# Patient Record
Sex: Male | Born: 1955 | Race: White | Hispanic: No | Marital: Married | Smoking: Never smoker
Health system: Southern US, Community
[De-identification: ages and names within clinical notes are randomized; demographics above are authoritative.]

## PROBLEM LIST (undated history)

## (undated) DIAGNOSIS — N400 Enlarged prostate without lower urinary tract symptoms: Secondary | ICD-10-CM

## (undated) DIAGNOSIS — I5189 Other ill-defined heart diseases: Secondary | ICD-10-CM

## (undated) DIAGNOSIS — R7303 Prediabetes: Secondary | ICD-10-CM

## (undated) DIAGNOSIS — I77810 Thoracic aortic ectasia: Secondary | ICD-10-CM

## (undated) DIAGNOSIS — I10 Essential (primary) hypertension: Secondary | ICD-10-CM

## (undated) DIAGNOSIS — E669 Obesity, unspecified: Secondary | ICD-10-CM

## (undated) DIAGNOSIS — I714 Abdominal aortic aneurysm, without rupture, unspecified: Secondary | ICD-10-CM

## (undated) DIAGNOSIS — M549 Dorsalgia, unspecified: Secondary | ICD-10-CM

## (undated) HISTORY — DX: Dorsalgia, unspecified: M54.9

## (undated) HISTORY — PX: COLONOSCOPY: SHX174

## (undated) HISTORY — DX: Essential (primary) hypertension: I10

## (undated) HISTORY — DX: Benign prostatic hyperplasia without lower urinary tract symptoms: N40.0

## (undated) HISTORY — DX: Prediabetes: R73.03

## (undated) HISTORY — DX: Other ill-defined heart diseases: I51.89

## (undated) HISTORY — PX: HEMORRHOID SURGERY: SHX153

## (undated) HISTORY — DX: Thoracic aortic ectasia: I77.810

## (undated) HISTORY — PX: POLYPECTOMY: SHX149

## (undated) HISTORY — DX: Obesity, unspecified: E66.9

---

## 1998-10-23 ENCOUNTER — Emergency Department (HOSPITAL_COMMUNITY): Admission: EM | Admit: 1998-10-23 | Discharge: 1998-10-23 | Payer: Self-pay | Admitting: Emergency Medicine

## 1999-03-31 ENCOUNTER — Emergency Department (HOSPITAL_COMMUNITY): Admission: EM | Admit: 1999-03-31 | Discharge: 1999-03-31 | Payer: Self-pay | Admitting: Emergency Medicine

## 1999-04-03 ENCOUNTER — Encounter (HOSPITAL_COMMUNITY): Admission: RE | Admit: 1999-04-03 | Discharge: 1999-04-03 | Payer: Self-pay

## 1999-05-01 ENCOUNTER — Ambulatory Visit (HOSPITAL_COMMUNITY): Admission: RE | Admit: 1999-05-01 | Discharge: 1999-05-01 | Payer: Self-pay | Admitting: Neurosurgery

## 1999-05-01 ENCOUNTER — Encounter: Payer: Self-pay | Admitting: Neurosurgery

## 1999-08-08 ENCOUNTER — Emergency Department (HOSPITAL_COMMUNITY): Admission: EM | Admit: 1999-08-08 | Discharge: 1999-08-08 | Payer: Self-pay | Admitting: Emergency Medicine

## 2000-06-30 ENCOUNTER — Emergency Department (HOSPITAL_COMMUNITY): Admission: EM | Admit: 2000-06-30 | Discharge: 2000-06-30 | Payer: Self-pay | Admitting: Emergency Medicine

## 2001-02-28 ENCOUNTER — Emergency Department (HOSPITAL_COMMUNITY): Admission: EM | Admit: 2001-02-28 | Discharge: 2001-02-28 | Payer: Self-pay | Admitting: Emergency Medicine

## 2006-12-08 ENCOUNTER — Ambulatory Visit (HOSPITAL_BASED_OUTPATIENT_CLINIC_OR_DEPARTMENT_OTHER): Admission: RE | Admit: 2006-12-08 | Discharge: 2006-12-08 | Payer: Self-pay | Admitting: Family Medicine

## 2006-12-14 ENCOUNTER — Ambulatory Visit: Payer: Self-pay | Admitting: Internal Medicine

## 2007-01-27 ENCOUNTER — Ambulatory Visit: Payer: Self-pay | Admitting: Pulmonary Disease

## 2007-08-19 ENCOUNTER — Encounter: Payer: Self-pay | Admitting: Pulmonary Disease

## 2007-08-20 ENCOUNTER — Encounter: Payer: Self-pay | Admitting: Pulmonary Disease

## 2007-08-20 DIAGNOSIS — G4733 Obstructive sleep apnea (adult) (pediatric): Secondary | ICD-10-CM | POA: Insufficient documentation

## 2007-10-01 ENCOUNTER — Ambulatory Visit: Payer: Self-pay | Admitting: Pulmonary Disease

## 2007-12-30 ENCOUNTER — Ambulatory Visit: Payer: Self-pay | Admitting: Pulmonary Disease

## 2008-06-27 ENCOUNTER — Ambulatory Visit: Payer: Self-pay | Admitting: Pulmonary Disease

## 2008-12-26 ENCOUNTER — Ambulatory Visit: Payer: Self-pay | Admitting: Pulmonary Disease

## 2009-07-14 ENCOUNTER — Encounter (INDEPENDENT_AMBULATORY_CARE_PROVIDER_SITE_OTHER): Payer: Self-pay | Admitting: *Deleted

## 2009-07-26 ENCOUNTER — Encounter (INDEPENDENT_AMBULATORY_CARE_PROVIDER_SITE_OTHER): Payer: Self-pay

## 2009-07-31 ENCOUNTER — Ambulatory Visit: Payer: Self-pay | Admitting: Gastroenterology

## 2009-08-11 ENCOUNTER — Ambulatory Visit: Payer: Self-pay | Admitting: Gastroenterology

## 2009-08-16 ENCOUNTER — Encounter: Payer: Self-pay | Admitting: Gastroenterology

## 2011-01-14 ENCOUNTER — Other Ambulatory Visit (HOSPITAL_COMMUNITY): Payer: Self-pay | Admitting: Family Medicine

## 2011-01-14 DIAGNOSIS — R05 Cough: Secondary | ICD-10-CM

## 2011-01-14 DIAGNOSIS — R059 Cough, unspecified: Secondary | ICD-10-CM

## 2011-01-14 DIAGNOSIS — T17998A Other foreign object in respiratory tract, part unspecified causing other injury, initial encounter: Secondary | ICD-10-CM

## 2011-01-15 NOTE — Assessment & Plan Note (Signed)
[facility]                             PULMONARY OFFICE NOTE   NAME:Curtis Warren, Curtis Warren                   MRN:          [id number]  DATE:01/27/2007                            DOB:          06-Sep-1955    HISTORY AND PHYSICAL:  The patient is a very pleasant 55 year old  gentleman who I have been asked to see for management of obstructive  sleep apnea.  The patient has been diagnosed with severe obstructive  sleep apnea by recent split-night study where he was found to have 92  obstructive events in 143 minutes of sleep.  This gave him an  extrapolated respiratory disturbance index of 39 events per hour.  The  patient was then placed on CPAP, and titration was initiated.  As the  pressure began to increase, central events began to appear, but not  overly severe.  He was ultimately changed over to BiPAP, and then a  final pressure of 12/80 seemed to have excellent control of his  obstructive events, and only a few central apneas that appeared to be  asymptomatic.  Patient was sent home on CPAP, and had very poor  tolerance, and therefore, was then placed on BiPAP.  Currently, the  patient is on BiPAP at 12/8 using a full face mask.  He had a difficult  time keeping his mouth closed, and therefore, needs to be on this.  Currently, he is averaging about 4 to 5 hours a night, primarily due to  dryness, and also because of itching inside of his nose.  The patient  has turned his heater up on the humidifier, but has not turned it up  further despite having dryness.  The patient typically goes to bed  between 10:30 and 11:30, and gets up between 7:30 and 8:30 to start his  day.  He is not completely rested in the mornings upon arising.  He is  currently retired, and notes mild sleep pressure with periods of  inactivity during the day, but does not feel that is overly significant.  He has no sleepiness with movies or TV in the evening.  He denies any  sleepiness with driving.  Of note, his weight is up about 10 to 12  pounds over the last 2 years.   PAST MEDICAL HISTORY:  Significant only for his sleep apnea, as stated  above.   CURRENT MEDICATIONS:  Include:  1. Flomax 0.4 mg q. daily.  2. Otherwise, an occasional p.r.n. medication with hydrocodone, Advil,      and Skelaxin.   SOCIAL HISTORY:  He is married and has children.  He has never smoked.   FAMILY HISTORY:  Remarkable for his father having heart disease, as well  as bone cancer.   REVIEW OF SYSTEMS:  As per history of present illness.  Also, see  patient intake form documented in the chart.   PHYSICAL EXAMINATION:  GENERAL:  He is an overweight male, in no acute  distress.  Blood pressure is 134/86.  Pulse 56.  Temperature is 97.7.  Weight is  200 pounds.  He is 5 feet 11 inches tall.  His O2 saturation on room air  is 97%.  HEENT:  Pupils equal, round, and reactive to light and accommodation.  Extraocular muscles are intact.  Nares shows mild septal deviation to  the right.  Oropharynx does show elongation of soft palate and uvula.  NECK:  Supple without JVD or lymphadenopathy.  There is no palpable  thyromegaly.  CHEST:  Totally clear.  CARDIAC EXAM:  Reveals regular rate and rhythm.  No murmurs, rubs, or  gallops.  ABDOMEN:  Soft and non-tender with good bowel sounds.  GENITAL EXAM:  Was not done, and not indicated.  RECTAL EXAM:  Was not done, and not indicated.  BREAST EXAM:  Was not done, and not indicated.  LOWER EXTREMITIES:  Without significant edema.  Pulses are intact  distally.  NEUROLOGIC:  Alert and oriented with no obvious motor deficits.   IMPRESSION:  Severe obstructive sleep apnea/hypopnea syndrome by split-  night study with a respiratory disturbance index of 39 events per hour.  Patient did poorly on CPAP, and his now using BiPAP with some success.  I am really not overly concerned about the central events that he had  during the study, and  they appeared definitely induced by the initiation  of the CPAP.  They were much less while on BiPAP.  At this point in  time, my goal will be to get him completely comfortable with the BiPAP,  and wearing it on a consistent basis, and then we will work on pressure  optimization.  I am not sure the pressure of 12/8 is totally adequate.  Patient had a split-night study, and therefore, had very little time  left for titration the rest of the night, and the titration was  complicated by the presence of central apneas.   PLAN:  1. Work on weight loss.  2. I have asked the patient to turn up the heat on his humidifier to      see if this will help with his dryness, and also his nasal itching.      I suspect as time goes by, that he will get more accustomed to the      CPAP, and wear it on a more regular basis.  I will give him 2 weeks      to get further adjusted, and then arrange for an auto BiPAP device      to be delivered, and used for the next 2 weeks for pressure      optimization.  I will then call the patient with his optimal      pressure.  3. The patient will follow up in 3 months, or sooner if there are      problems.     Barbaraann Share, MD,FCCP  Electronically Signed    [facility]/Med[facility]  DD: 01/27/2007  DT: 01/27/2007  Job #: [id number]   cc:   Dario Guardian, M.D.

## 2011-01-16 ENCOUNTER — Ambulatory Visit (HOSPITAL_COMMUNITY)
Admission: RE | Admit: 2011-01-16 | Discharge: 2011-01-16 | Disposition: A | Discharge status: Home / Self Care | Payer: 59 | Source: Ambulatory Visit | Attending: Family Medicine | Admitting: Family Medicine

## 2011-01-16 DIAGNOSIS — R059 Cough, unspecified: Secondary | ICD-10-CM | POA: Insufficient documentation

## 2011-01-16 DIAGNOSIS — R05 Cough: Secondary | ICD-10-CM | POA: Insufficient documentation

## 2011-01-16 DIAGNOSIS — R0602 Shortness of breath: Secondary | ICD-10-CM | POA: Insufficient documentation

## 2011-01-16 DIAGNOSIS — R1310 Dysphagia, unspecified: Secondary | ICD-10-CM | POA: Insufficient documentation

## 2011-01-16 DIAGNOSIS — T17998A Other foreign object in respiratory tract, part unspecified causing other injury, initial encounter: Secondary | ICD-10-CM

## 2011-01-16 DIAGNOSIS — M47812 Spondylosis without myelopathy or radiculopathy, cervical region: Secondary | ICD-10-CM | POA: Insufficient documentation

## 2011-01-18 NOTE — Procedures (Signed)
NAME:  Curtis Warren, Curtis Warren NO.:  [id number]   MEDICAL RECORD NO.:  [id number]          PATIENT TYPE:  OUT   LOCATION:  SLEEP CENTER                 FACILITY:  [facility]   PHYSICIAN:  Clinton D. Maple Hudson, MD, FCCP, FACPDATE OF BIRTH:  09-[birth year]-1955   DATE OF STUDY:  12/08/2006                            NOCTURNAL POLYSOMNOGRAM   REFERRING PHYSICIAN:  Dario Guardian, M.D.   INDICATION FOR STUDY:  Hypersomnia with sleep apnea.  Question of  syncopal episodes.   EPWORTH SLEEPINESS SCORE:   MEDICATIONS:  Home medication reported hydrocodone, Flomax.   SLEEP ARCHITECTURE:  Total sleep time 361 minutes with sleep efficiency  81%.  Stage I was 11%, stage II 68%, stages III and IV absent.  REM 20%  of total sleep time.  Sleep latency 28 minutes, REM latency 16.5  minutes, awake after sleep onset 57 minutes, arousal index 5.8.  No  bedtime medication was taken.   RESPIRATORY DATA:  Split study protocol.  Apnea-hypopnea index (AHI,  RDI) 39 obstructive and central events per hour, indicating moderately  severe obstructive sleep apnea syndrome before CPAP.  This included 60  obstructive apneas, 1 central apnea and 32 hypopneas before CPAP.  Events were not positional.  REM AHI 22.2.  CPAP titration to 11 CWP  gave incomplete control with an AHI of 33.1 per hour.  Bi-level  titration was then performed to an inspiratory pressure of 12 and  expiratory pressure of 8, for an AHI of 7.1 per hour with 4 residual  obstructive apneas and 5 central apneas with 9 hypopneas during 89  minutes of recording at this pressure setting.  Central events became  more common at higher pressures.  The technician suggested  servoventilation (EG and Adapt SV) to cover the complex apnea pattern  based on bedside observation.   OXYGEN DATA:  Snoring with oxygen desaturation to a nadir of 87%.  Oxygen saturation at higher PAP pressures is 95% on room air.  A small  ResMed Quattro mask was used with heated  humidifier.   CARDIAC DATA:  Normal sinus rhythm.   MOVEMENT-PARASOMNIA:  Occasional limb jerks with insignificant effect on  sleep.   IMPRESSIONS-RECOMMENDATIONS:  1. Moderately severe central and obstructive sleep apnea/hypopnea      syndrome, apnea-hypopnea index 39 per hour with nonpositional      events, snoring and oxygen desaturation to a nadir of 87%.  2. Continuous positive airway pressure titration to 11 CWP and bi-      level titration to inspiratory pressure of 12, expiratory pressure      of 8 provided insufficient control.  Higher pressures may be tried      with these devices, but appearance of central apneas suggests that      a servoventilator (Adapt SV) may be the most effective      intervention.  Consultation is available if desired.  A small      ResMed Quattro mask was used with heated humidifier.      Clinton D. Maple Hudson, MD, FCCP, FACP  Diplomate, Biomedical engineer of Sleep Medicine  Electronically Signed     CDY/MEDQ  D:  12/14/2006 08:48:57  T:  12/14/2006 09:29:38  Job:  [id number]

## 2011-04-26 ENCOUNTER — Emergency Department (HOSPITAL_COMMUNITY)
Admission: EM | Admit: 2011-04-26 | Discharge: 2011-04-26 | Disposition: A | Discharge status: Home / Self Care | Payer: 59 | Attending: Emergency Medicine | Admitting: Emergency Medicine

## 2011-04-26 ENCOUNTER — Emergency Department (HOSPITAL_COMMUNITY): Payer: 59

## 2011-04-26 DIAGNOSIS — S335XXA Sprain of ligaments of lumbar spine, initial encounter: Secondary | ICD-10-CM | POA: Insufficient documentation

## 2011-04-26 DIAGNOSIS — R209 Unspecified disturbances of skin sensation: Secondary | ICD-10-CM | POA: Insufficient documentation

## 2011-04-26 DIAGNOSIS — M545 Low back pain, unspecified: Secondary | ICD-10-CM | POA: Insufficient documentation

## 2011-04-26 DIAGNOSIS — W108XXA Fall (on) (from) other stairs and steps, initial encounter: Secondary | ICD-10-CM | POA: Insufficient documentation

## 2011-04-29 ENCOUNTER — Other Ambulatory Visit: Payer: Self-pay | Admitting: Family Medicine

## 2011-04-29 ENCOUNTER — Other Ambulatory Visit (HOSPITAL_COMMUNITY): Payer: Self-pay | Admitting: Family Medicine

## 2011-04-29 ENCOUNTER — Ambulatory Visit
Admission: RE | Admit: 2011-04-29 | Discharge: 2011-04-29 | Disposition: A | Discharge status: Home / Self Care | Payer: 59 | Source: Ambulatory Visit | Attending: Family Medicine | Admitting: Family Medicine

## 2011-04-29 DIAGNOSIS — M545 Low back pain, unspecified: Secondary | ICD-10-CM

## 2011-05-09 ENCOUNTER — Other Ambulatory Visit: Payer: Self-pay | Admitting: Neurosurgery

## 2011-05-09 DIAGNOSIS — M545 Low back pain: Secondary | ICD-10-CM

## 2011-05-11 ENCOUNTER — Ambulatory Visit
Admission: RE | Admit: 2011-05-11 | Discharge: 2011-05-11 | Disposition: A | Discharge status: Home / Self Care | Payer: 59 | Source: Ambulatory Visit | Attending: Neurosurgery | Admitting: Neurosurgery

## 2011-05-11 DIAGNOSIS — M545 Low back pain: Secondary | ICD-10-CM

## 2011-05-11 MED ORDER — GADOBENATE DIMEGLUMINE 529 MG/ML IV SOLN
20.0000 mL | Freq: Once | INTRAVENOUS | Status: AC | PRN
  Administered 2011-05-11: 20 mL via INTRAVENOUS

## 2012-07-08 ENCOUNTER — Encounter: Payer: Self-pay | Admitting: Gastroenterology

## 2012-09-08 ENCOUNTER — Encounter: Payer: Self-pay | Admitting: Gastroenterology

## 2012-09-29 ENCOUNTER — Ambulatory Visit (AMBULATORY_SURGERY_CENTER): Payer: 59

## 2012-09-29 VITALS — Ht 71.0 in | Wt 226.6 lb

## 2012-09-29 DIAGNOSIS — Z1211 Encounter for screening for malignant neoplasm of colon: Secondary | ICD-10-CM

## 2012-09-29 DIAGNOSIS — Z86010 Personal history of colonic polyps: Secondary | ICD-10-CM

## 2012-09-29 MED ORDER — MOVIPREP 100 G PO SOLR: ORAL | Status: DC

## 2012-10-05 ENCOUNTER — Other Ambulatory Visit: Payer: 59 | Admitting: Gastroenterology

## 2012-10-12 ENCOUNTER — Encounter: Payer: Self-pay | Admitting: Gastroenterology

## 2012-10-12 ENCOUNTER — Ambulatory Visit (AMBULATORY_SURGERY_CENTER): Payer: 59 | Admitting: Gastroenterology

## 2012-10-12 VITALS — BP 122/73 | HR 71 | Temp 97.1°F | Resp 20 | Ht 71.0 in | Wt 226.0 lb

## 2012-10-12 DIAGNOSIS — Z860100 Personal history of colon polyps, unspecified: Secondary | ICD-10-CM

## 2012-10-12 DIAGNOSIS — Z1211 Encounter for screening for malignant neoplasm of colon: Secondary | ICD-10-CM

## 2012-10-12 DIAGNOSIS — D126 Benign neoplasm of colon, unspecified: Secondary | ICD-10-CM

## 2012-10-12 MED ORDER — SODIUM CHLORIDE 0.9 % IV SOLN: 500.0000 mL | INTRAVENOUS | Status: DC

## 2012-10-12 NOTE — Progress Notes (Signed)
Patient stating he does not have pain at present, only hunger pains. No audible flatus passed.

## 2012-10-12 NOTE — Patient Instructions (Signed)
YOU HAD AN ENDOSCOPIC PROCEDURE TODAY AT THE [facility]: Refer to the procedure report that was given to you for any specific questions about what was found during the examination.  If the procedure report does not answer your questions, please call your gastroenterologist to clarify.  If you requested that your care partner not be given the details of your procedure findings, then the procedure report has been included in a sealed envelope for you to review at your convenience later.  YOU SHOULD EXPECT: Some feelings of bloating in the abdomen. Passage of more gas than usual.  Walking can help get rid of the air that was put into your GI tract during the procedure and reduce the bloating. If you had a lower endoscopy (such as a colonoscopy or flexible sigmoidoscopy) you may notice spotting of blood in your stool or on the toilet paper. If you underwent a bowel prep for your procedure, then you may not have a normal bowel movement for a few days.  DIET: Your first meal following the procedure should be a light meal and then it is ok to progress to your normal diet.  A half-sandwich or bowl of soup is an example of a good first meal.  Heavy or fried foods are harder to digest and may make you feel nauseous or bloated.  Likewise meals heavy in dairy and vegetables can cause extra gas to form and this can also increase the bloating.  Drink plenty of fluids but you should avoid alcoholic beverages for 24 hours.  ACTIVITY: Your care partner should take you home directly after the procedure.  You should plan to take it easy, moving slowly for the rest of the day.  You can resume normal activity the day after the procedure however you should NOT DRIVE or use heavy machinery for 24 hours (because of the sedation medicines used during the test).    SYMPTOMS TO REPORT IMMEDIATELY: A gastroenterologist can be reached at any hour.  During normal business hours, 8:30 AM to 5:00 PM Monday through Friday,  call [phone].  After hours and on weekends, please call the GI answering service at [phone] who will take a message and have the physician on call contact you.   Following lower endoscopy (colonoscopy or flexible sigmoidoscopy):  Excessive amounts of blood in the stool  Significant tenderness or worsening of abdominal pains  Swelling of the abdomen that is new, acute  Fever of 100F or higher    FOLLOW UP: If any biopsies were taken you will be contacted by phone or by letter within the next 1-3 weeks.  Call your gastroenterologist if you have not heard about the biopsies in 3 weeks.  Our staff will call the home number listed on your records the next business day following your procedure to check on you and address any questions or concerns that you may have at that time regarding the information given to you following your procedure. This is a courtesy call and so if there is no answer at the home number and we have not heard from you through the emergency physician on call, we will assume that you have returned to your regular daily activities without incident.  SIGNATURES/CONFIDENTIALITY: You and/or your care partner have signed paperwork which will be entered into your electronic medical record.  These signatures attest to the fact that that the information above on your After Visit Summary has been reviewed and is understood.  Full responsibility of the confidentiality   of this discharge information lies with you and/or your care-partner.     

## 2012-10-12 NOTE — Progress Notes (Signed)
Patient did not experience any of the following events: a burn prior to discharge; a fall within the facility; wrong site/side/patient/procedure/implant event; or a hospital transfer or hospital admission upon discharge from the facility. (G8907) Patient did not have preoperative order for IV antibiotic SSI prophylaxis. (G8918)  

## 2012-10-12 NOTE — Op Note (Signed)
[facility] [address] [place], [address]   COLONOSCOPY PROCEDURE REPORT  PATIENT: Curtis Warren, Curtis Warren  MR#: [id number] BIRTHDATE: Jul 15, 1956 , 56  yrs. old GENDER: Male ENDOSCOPIST: Rachael Fee, MD PROCEDURE DATE:  10/12/2012 PROCEDURE:   Colonoscopy with snare polypectomy ASA CLASS:   Class II INDICATIONS:adenomas removed 2010 (one was >1cm). MEDICATIONS: Fentanyl 50 mcg IV, Versed 5 mg IV, and These medications were titrated to patient response per physician's verbal order  DESCRIPTION OF PROCEDURE:   After the risks benefits and alternatives of the procedure were thoroughly explained, informed consent was obtained.  A digital rectal exam revealed no abnormalities of the rectum.   The LB CF-[id number]  endoscope was introduced through the anus and advanced to the cecum, which was identified by both the appendix and ileocecal valve. No adverse events experienced.   The quality of the prep was good, using MoviPrep  The instrument was then slowly withdrawn as the colon was fully examined.  COLON FINDINGS: Two small polyps were found, removed and sent to pathlogy.  These were both sessile, located at hepatic flexure, descending segment, both removed with cold snare, ranging in size from 2mm to 5mm.  The larger was retrieved and sent to pathology. The examination was otherwise normal.  Retroflexed views revealed no abnormalities. The time to cecum=4 minutes 40 seconds. Withdrawal time=10 minutes 25 seconds.  The scope was withdrawn and the procedure completed. COMPLICATIONS: There were no complications.  ENDOSCOPIC IMPRESSION: Two small polyps were found, removed and (one was retrieved and sent to pathlogy). The examination was otherwise normal.  RECOMMENDATIONS: If the polyp(s) removed today are proven to be adenomatous (pre-cancerous) polyps, you will need a repeat colonoscopy in 5 years.   You will receive a letter within 1-2 weeks with  the results of your biopsy as well as final recommendations.  Please call my office if you have not received a letter after 3 weeks.   eSigned:  Rachael Fee, MD 10/12/2012 1:25 PM   cc: Merri Brunette, MD

## 2012-10-13 ENCOUNTER — Telehealth: Payer: Self-pay | Admitting: *Deleted

## 2012-10-13 NOTE — Telephone Encounter (Signed)
  Follow up Call-  Call back number 10/12/2012  Post procedure Call Back phone  # [phone]  Permission to leave phone message Yes     Patient questions:  Do you have a fever, pain , or abdominal swelling? no Pain Score  0 *  Have you tolerated food without any problems? yes  Have you been able to return to your normal activities? yes  Do you have any questions about your discharge instructions: Diet   no Medications  no Follow up visit  no  Do you have questions or concerns about your Care? no  Actions: * If pain score is 4 or above: No action needed, pain <4.

## 2012-10-21 ENCOUNTER — Encounter: Payer: Self-pay | Admitting: Gastroenterology

## 2012-10-28 ENCOUNTER — Other Ambulatory Visit: Payer: 59 | Admitting: Gastroenterology

## 2012-11-02 ENCOUNTER — Other Ambulatory Visit: Payer: 59 | Admitting: Gastroenterology

## 2014-02-10 ENCOUNTER — Encounter: Payer: Self-pay | Admitting: Internal Medicine

## 2015-02-06 ENCOUNTER — Encounter: Payer: Self-pay | Admitting: Gastroenterology

## 2015-12-08 ENCOUNTER — Emergency Department (HOSPITAL_COMMUNITY): Payer: Self-pay

## 2015-12-08 ENCOUNTER — Encounter (HOSPITAL_COMMUNITY): Payer: Self-pay | Admitting: *Deleted

## 2015-12-08 ENCOUNTER — Observation Stay (HOSPITAL_COMMUNITY)
Admission: EM | Admit: 2015-12-08 | Discharge: 2015-12-09 | Disposition: A | Discharge status: Home / Self Care | Payer: 59 | Attending: Family Medicine | Admitting: Family Medicine

## 2015-12-08 DIAGNOSIS — R072 Precordial pain: Principal | ICD-10-CM | POA: Insufficient documentation

## 2015-12-08 DIAGNOSIS — E669 Obesity, unspecified: Secondary | ICD-10-CM | POA: Insufficient documentation

## 2015-12-08 DIAGNOSIS — I10 Essential (primary) hypertension: Secondary | ICD-10-CM | POA: Insufficient documentation

## 2015-12-08 DIAGNOSIS — I119 Hypertensive heart disease without heart failure: Secondary | ICD-10-CM | POA: Diagnosis not present

## 2015-12-08 DIAGNOSIS — I7121 Aneurysm of the ascending aorta, without rupture: Secondary | ICD-10-CM | POA: Diagnosis present

## 2015-12-08 DIAGNOSIS — R079 Chest pain, unspecified: Secondary | ICD-10-CM

## 2015-12-08 DIAGNOSIS — Z79899 Other long term (current) drug therapy: Secondary | ICD-10-CM | POA: Insufficient documentation

## 2015-12-08 DIAGNOSIS — Z6833 Body mass index (BMI) 33.0-33.9, adult: Secondary | ICD-10-CM | POA: Insufficient documentation

## 2015-12-08 DIAGNOSIS — Z8249 Family history of ischemic heart disease and other diseases of the circulatory system: Secondary | ICD-10-CM | POA: Insufficient documentation

## 2015-12-08 DIAGNOSIS — N400 Enlarged prostate without lower urinary tract symptoms: Secondary | ICD-10-CM | POA: Insufficient documentation

## 2015-12-08 DIAGNOSIS — I712 Thoracic aortic aneurysm, without rupture: Secondary | ICD-10-CM | POA: Diagnosis not present

## 2015-12-08 DIAGNOSIS — G4733 Obstructive sleep apnea (adult) (pediatric): Secondary | ICD-10-CM | POA: Insufficient documentation

## 2015-12-08 LAB — CBC WITH DIFFERENTIAL/PLATELET
Basophils Absolute: 0 10*3/uL (ref 0.0–0.1)
Basophils Relative: 0 %
EOS PCT: 4 %
Eosinophils Absolute: 0.3 10*3/uL (ref 0.0–0.7)
HCT: 47.3 % (ref 39.0–52.0)
Hemoglobin: 15.7 g/dL (ref 13.0–17.0)
LYMPHS ABS: 0.9 10*3/uL (ref 0.7–4.0)
LYMPHS PCT: 11 %
MCH: 30.2 pg (ref 26.0–34.0)
MCHC: 33.2 g/dL (ref 30.0–36.0)
MCV: 91 fL (ref 78.0–100.0)
MONO ABS: 1.4 10*3/uL — AB (ref 0.1–1.0)
Monocytes Relative: 16 %
Neutro Abs: 5.9 10*3/uL (ref 1.7–7.7)
Neutrophils Relative %: 69 %
PLATELETS: 138 10*3/uL — AB (ref 150–400)
RBC: 5.2 MIL/uL (ref 4.22–5.81)
RDW: 12.8 % (ref 11.5–15.5)
WBC: 8.5 10*3/uL (ref 4.0–10.5)

## 2015-12-08 LAB — COMPREHENSIVE METABOLIC PANEL
ALT: 51 U/L (ref 17–63)
AST: 37 U/L (ref 15–41)
Albumin: 3.8 g/dL (ref 3.5–5.0)
Alkaline Phosphatase: 97 U/L (ref 38–126)
Anion gap: 10 (ref 5–15)
BILIRUBIN TOTAL: 1 mg/dL (ref 0.3–1.2)
BUN: 15 mg/dL (ref 6–20)
CALCIUM: 9.2 mg/dL (ref 8.9–10.3)
CO2: 22 mmol/L (ref 22–32)
CREATININE: 1.23 mg/dL (ref 0.61–1.24)
Chloride: 107 mmol/L (ref 101–111)
Glucose, Bld: 121 mg/dL — ABNORMAL HIGH (ref 65–99)
Potassium: 4.9 mmol/L (ref 3.5–5.1)
Sodium: 139 mmol/L (ref 135–145)
TOTAL PROTEIN: 6.8 g/dL (ref 6.5–8.1)

## 2015-12-08 LAB — I-STAT TROPONIN, ED
TROPONIN I, POC: 0 ng/mL (ref 0.00–0.08)
Troponin i, poc: 0 ng/mL (ref 0.00–0.08)

## 2015-12-08 LAB — HEPARIN LEVEL (UNFRACTIONATED): Heparin Unfractionated: 0.33 IU/mL (ref 0.30–0.70)

## 2015-12-08 LAB — PROTIME-INR
INR: 1.06 (ref 0.00–1.49)
Prothrombin Time: 14 seconds (ref 11.6–15.2)

## 2015-12-08 LAB — APTT: aPTT: 39 seconds — ABNORMAL HIGH (ref 24–37)

## 2015-12-08 MED ORDER — MORPHINE SULFATE (PF) 2 MG/ML IV SOLN: 2.0000 mg | INTRAVENOUS | Status: DC | PRN

## 2015-12-08 MED ORDER — HEPARIN BOLUS VIA INFUSION
4000.0000 [IU] | Freq: Once | INTRAVENOUS | Status: AC
  Administered 2015-12-08: 4000 [IU] via INTRAVENOUS
  Filled 2015-12-08: qty 4000

## 2015-12-08 MED ORDER — ACETAMINOPHEN 325 MG PO TABS
650.0000 mg | ORAL_TABLET | ORAL | Status: DC | PRN
  Administered 2015-12-08 – 2015-12-09 (×2): 650 mg via ORAL
  Filled 2015-12-08 (×2): qty 2

## 2015-12-08 MED ORDER — IOPAMIDOL (ISOVUE-370) INJECTION 76%
INTRAVENOUS | Status: AC
  Administered 2015-12-08: 80 mL
  Filled 2015-12-08: qty 100

## 2015-12-08 MED ORDER — ONDANSETRON HCL 4 MG/2ML IJ SOLN: 4.0000 mg | Freq: Three times a day (TID) | INTRAMUSCULAR | Status: AC | PRN

## 2015-12-08 MED ORDER — ALPRAZOLAM 0.25 MG PO TABS: 0.2500 mg | ORAL_TABLET | Freq: Two times a day (BID) | ORAL | Status: DC | PRN

## 2015-12-08 MED ORDER — HEPARIN (PORCINE) IN NACL 100-0.45 UNIT/ML-% IJ SOLN
1350.0000 [IU]/h | INTRAMUSCULAR | Status: DC
  Administered 2015-12-08: 1250 [IU]/h via INTRAVENOUS
  Administered 2015-12-09: 1350 [IU]/h via INTRAVENOUS
  Filled 2015-12-08 (×2): qty 250

## 2015-12-08 MED ORDER — METOPROLOL TARTRATE 12.5 MG HALF TABLET
12.5000 mg | ORAL_TABLET | Freq: Two times a day (BID) | ORAL | Status: DC
  Administered 2015-12-08 – 2015-12-09 (×2): 12.5 mg via ORAL
  Filled 2015-12-08 (×2): qty 1

## 2015-12-08 MED ORDER — TAMSULOSIN HCL 0.4 MG PO CAPS
0.4000 mg | ORAL_CAPSULE | Freq: Every day | ORAL | Status: DC
  Administered 2015-12-09: 0.4 mg via ORAL
  Filled 2015-12-08: qty 1

## 2015-12-08 MED ORDER — FINASTERIDE 5 MG PO TABS
5.0000 mg | ORAL_TABLET | Freq: Every day | ORAL | Status: DC
  Administered 2015-12-09: 5 mg via ORAL
  Filled 2015-12-08: qty 1

## 2015-12-08 MED ORDER — GUAIFENESIN ER 600 MG PO TB12
1200.0000 mg | ORAL_TABLET | Freq: Two times a day (BID) | ORAL | Status: DC
  Administered 2015-12-08 – 2015-12-09 (×2): 1200 mg via ORAL
  Filled 2015-12-08 (×2): qty 2

## 2015-12-08 MED ORDER — GI COCKTAIL ~~LOC~~: 30.0000 mL | Freq: Four times a day (QID) | ORAL | Status: DC | PRN

## 2015-12-08 MED ORDER — SODIUM CHLORIDE 0.9 % IV SOLN
INTRAVENOUS | Status: DC
  Administered 2015-12-08 – 2015-12-09 (×2): via INTRAVENOUS

## 2015-12-08 MED ORDER — SODIUM CHLORIDE 0.9 % IV BOLUS (SEPSIS)
500.0000 mL | Freq: Once | INTRAVENOUS | Status: AC
  Administered 2015-12-08: 500 mL via INTRAVENOUS

## 2015-12-08 NOTE — ED Notes (Signed)
Attempted report to 2W °

## 2015-12-08 NOTE — ED Notes (Signed)
Called CT to inform them pt is ready for transport.

## 2015-12-08 NOTE — ED Notes (Signed)
Patient has returned from being out of the department; patient placed back on monitor, continuous pulse oximetry and blood pressure cuff; visitor at bedsided

## 2015-12-08 NOTE — ED Notes (Signed)
Patient transported to X-ray 

## 2015-12-08 NOTE — ED Notes (Signed)
Phlebotomy at bedside.

## 2015-12-08 NOTE — ED Notes (Signed)
Pt arrives from home via [facility]. Pt was taking out his trash this morning and had a sudden onset of centralized CP that radiated to his back between his shoulder blades. Pt states he became SOB during the episode and states en route to the hospital he began having left arm pain. Pt received 324mg  ASA and 1 nitro. Pt pain is now a 1/10, initially it was a 10/10. Pt has no known risk factors and no cardiac hx.

## 2015-12-08 NOTE — Consult Note (Signed)
CARDIOLOGY CONSULT NOTE   Patient ID: Curtis Warren MRN: [id number], DOB/AGE: [birth year]   Admit date: 12/08/2015 Date of Consult: 12/08/2015   Primary Physician: Reginia Naas, MD Primary Cardiologist: new - Dr. Meda Coffee  Pt. Profile  Curtis Warren is a pleasant obese 60 year old male WITHOUT past medical history of hypertension, hyperlipidemia, DM or any cardiac disease who presented with chest pain  Problem List  Past Medical History  Diagnosis Date  . Back pain   . BPH (benign prostatic hyperplasia)     Past Surgical History  Procedure Laterality Date  . Hemorrhoid surgery    . Colonoscopy    . Polypectomy       Allergies  No Known Allergies  HPI   Curtis Warren is a pleasant obese 60 year old male WITHOUT past medical history of hypertension, hyperlipidemia, DM or any cardiac disease. He is a retired Equities trader. He does occasionally work in the yard and a cough, however does not do any strenuous activity. He has never had any chest pain past. This morning, he was taking the garbage outside, he reached down to grab the garbage bag when he started having sharp substernal chest pain radiating to the back and across the entire chest. He denies any shortness of breath, diaphoresis, or dizziness. He did not try to see if there is anything alleviates his symptom, his wife told him to sit down and called 911 immediately. EMS gave him some aspirin and nitroglycerin which immediately relieved the symptom, his chest pain completely went away after several hours and has not recurred since.  He denies any recent fever, chill, lower extremity edema, orthopnea or paroxysmal nocturnal dyspnea, he does have occasional cough which he attributes to seasonal allergies. Upon arrival in the ED, initial EKG showed no significant ST-T wave changes. Troponin was normal. He was admitted to the internal medicine service and cardiology was consulted. It appears he is estranged daughter  who he has not seen for the past 3 years has showed up in the ED to visit the patient, he was very emotional from this, however also stating that the chest pain occurred before he even knew his daughter was coming to visit him.      Inpatient Medications  . [START ON 12/09/2015] finasteride  5 mg Oral Daily  . tamsulosin  0.4 mg Oral Daily    Family History Family History  Problem Relation Age of Onset  . Alcoholism Mother   . Bone cancer Father   . Heart attack Father 25  . Colon cancer Neg Hx      Social History Social History   Social History  . Marital Status: Married    Spouse Name: N/A  . Number of Children: N/A  . Years of Education: N/A   Occupational History  . Not on file.   Social History Main Topics  . Smoking status: Never Smoker   . Smokeless tobacco: Never Used  . Alcohol Use: No  . Drug Use: No  . Sexual Activity: Not on file   Other Topics Concern  . Not on file   Social History Narrative     Review of Systems  General:  No chills, fever, night sweats or weight changes.  Cardiovascular:  No dyspnea on exertion, edema, orthopnea, palpitations, paroxysmal nocturnal dyspnea. +chest pain Dermatological: No rash, lesions/masses Respiratory: No cough, dyspnea Urologic: No hematuria, dysuria Abdominal:   No nausea, vomiting, diarrhea, bright red blood per rectum, melena, or hematemesis Neurologic:  No visual changes, wkns,  changes in mental status. All other systems reviewed and are otherwise negative except as noted above.  Physical Exam  Blood pressure 137/92, pulse 101, temperature 98.7 F (37.1 C), temperature source Oral, resp. rate 17, height 5\' 11"  (1.803 m), weight 238 lb (107.956 kg), SpO2 95 %.  General: Pleasant, NAD Psych: Normal affect. Neuro: Alert and oriented X 3. Moves all extremities spontaneously. HEENT: Normal  Neck: Supple without bruits or JVD. Lungs:  Resp regular and unlabored, CTA. Heart: RRR no s3, s4, or  murmurs. Abdomen: Soft, non-tender, non-distended, BS + x 4.  Extremities: No clubbing, cyanosis or edema. DP/PT/Radials 2+ and equal bilaterally.  Labs  No results for input(s): CKTOTAL, CKMB, TROPONINI in the last 72 hours. Lab Results  Component Value Date   WBC 8.5 12/08/2015   HGB 15.7 12/08/2015   HCT 47.3 12/08/2015   MCV 91.0 12/08/2015   PLT 138* 12/08/2015     Recent Labs Lab 12/08/15 1024  NA 139  K 4.9  CL 107  CO2 22  BUN 15  CREATININE 1.23  CALCIUM 9.2  PROT 6.8  BILITOT 1.0  ALKPHOS 97  ALT 51  AST 37  GLUCOSE 121*   No results found for: CHOL, HDL, LDLCALC, TRIG No results found for: DDIMER  Radiology/Studies  Dg Chest 2 View  12/08/2015  CLINICAL DATA:  Left side chest pain starting this morning, sleep apnea EXAM: CHEST  2 VIEW COMPARISON:  None. FINDINGS: Cardiomediastinal silhouette is unremarkable. No acute infiltrate or pleural effusion. No pulmonary edema. Bony thorax is unremarkable. IMPRESSION: No active cardiopulmonary disease. Electronically Signed   By: Lahoma Crocker M.D.   On: 12/08/2015 11:03   Ct Angio Chest Aorta W/cm &/or Wo/cm  12/08/2015  CLINICAL DATA:  Acute chest pain. EXAM: CT ANGIOGRAPHY CHEST WITH CONTRAST TECHNIQUE: Multidetector CT imaging of the chest was performed using the standard protocol during bolus administration of intravenous contrast. Multiplanar CT image reconstructions and MIPs were obtained to evaluate the vascular anatomy. CONTRAST:  80 mL of Isovue 370 intravenously. COMPARISON:  Chest radiograph same day. FINDINGS: No pneumothorax or pleural effusion is noted. No acute pulmonary disease is noted. Visualized portion of upper abdomen is unremarkable. There is no evidence of thoracic aortic dissection. 4.0 cm ascending thoracic aortic aneurysm is noted. No mediastinal mass or adenopathy is noted. 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 2.7 cm descending thoracic aorta is noted. No significant osseous abnormality is noted.  Review of the MIP images confirms the above findings. IMPRESSION: No evidence of thoracic aortic dissection. 4.0 cm ascending thoracic aortic aneurysm is noted. Recommend annual imaging followup by CTA or MRA. This recommendation follows 2010 ACCF/AHA/AATS/ACR/ASA/SCA/SCAI/SIR/STS/SVM Guidelines for the Diagnosis and Management of Patients with Thoracic Aortic Disease. Circulation. 2010; [id number]. Electronically Signed   By: Marijo Conception, M.D.   On: 12/08/2015 12:12    ECG  Normal sinus rhythm without significant ST-T wave changes.  ASSESSMENT AND PLAN  1. Chest pain at rest  - CT of the chest was negative for aortic dissection, however did show 4 cm ascending aortic aneurysm, this will need to be monitored as outpatient.  - Overall atypical features, trend troponin overnight, potentially do treadmill Myoview tomorrow AM  Signed, Almyra Deforest, PA-C 12/08/2015, 2:58 PM   The patient was seen, examined and discussed with Almyra Deforest, PA-C and I agree with the above.   60 year old male with PMH of untreated HTN and positive FH of premature CAD ( father MI at  age 23) who presented with sharp retrosternal chest pain while carrying a trash can, with improvement with sl NTG.  Divide chest to negative for aortic dissection but with ascending aortic aneurysm measuring 40 mm. Troponin negative x2, BP elevated, with ascending aortic aneurysm we have to be aggressive with BP control, I would start losartan 25 mg po daily and metoprolol 12.5 mg PO BID tomorrow after the stress test. Start ASA 81 mg po daily. Schedule an exercise nuclear stress test for tomorrow, if negative discharge home.   Dorothy Spark 12/08/2015

## 2015-12-08 NOTE — Progress Notes (Signed)
[place] for heparin Indication: chest pain/ACS  No Known Allergies  Patient Measurements: Height: 5\' 11"  (180.3 cm) Weight: 238 lb (107.956 kg) IBW/kg (Calculated) : 75.3 Heparin Dosing Weight: 98.3  Vital Signs: Temp: 98.7 F (37.1 C) (04/07 1426) Temp Source: Oral (04/07 1426) BP: 137/92 mmHg (04/07 1426) Pulse Rate: 101 (04/07 1426)  Labs:  Recent Labs  12/08/15 1024 12/08/15 2030  HGB 15.7  --   HCT 47.3  --   PLT 138*  --   APTT 39*  --   LABPROT 14.0  --   INR 1.06  --   HEPARINUNFRC  --  0.33  CREATININE 1.23  --     Estimated Creatinine Clearance: 78.9 mL/min (by C-G formula based on Cr of 1.23).  Assessment: 60 yo M with CP, started on heparin for ACS/STEMI.  Pt has new ascending aortic aneurysm per CTA.  Troponin negative so far.  H/H WNL but PLTC low at 138.  No bleeding reported.  First HL in range at 0.33units/mL- low end of goal.  Goal of Therapy:  Heparin level 0.3-0.7 units/ml Monitor platelets by anticoagulation protocol: Yes   Plan:  -Increase heparin slightly to 1350 units/hr to ensure level stays in range -Next HL with AM labs -Daily HL and CBC  Stasha Naraine D. Wynter Grave, PharmD, BCPS Clinical Pharmacist Pager: [phone] 12/08/2015 9:01 PM

## 2015-12-08 NOTE — Progress Notes (Signed)
ANTICOAGULATION CONSULT NOTE - Initial Consult  Pharmacy Consult for heparin Indication: chest pain/ACS  No Known Allergies  Patient Measurements: Height: 5\' 11"  (180.3 cm) Weight: 238 lb (107.956 kg) IBW/kg (Calculated) : 75.3 Heparin Dosing Weight: 98.3  Vital Signs: Temp: 98.6 F (37 C) (04/07 1006) Temp Source: Oral (04/07 1006) BP: 148/98 mmHg (04/07 1345) Pulse Rate: 84 (04/07 1345)  Labs:  Recent Labs  12/08/15 1024  HGB 15.7  HCT 47.3  PLT 138*  APTT 39*  LABPROT 14.0  INR 1.06  CREATININE 1.23    Estimated Creatinine Clearance: 78.9 mL/min (by C-G formula based on Cr of 1.23).   Medical History: Past Medical History  Diagnosis Date  . Back pain   . BPH (benign prostatic hyperplasia)     Medications:  Prescriptions prior to admission  Medication Sig Dispense Refill Last Dose  . cetirizine (ZYRTEC) 10 MG tablet Take 10 mg by mouth daily.   12/08/2015 at Unknown time  . Coenzyme Q10 (CO Q 10 PO) Take 1 tablet by mouth daily.   12/08/2015 at Unknown time  . Cyanocobalamin (VITAMIN B 12 PO) Take 1 tablet by mouth daily.   12/08/2015 at Unknown time  . finasteride (PROSCAR) 5 MG tablet Take 5 mg by mouth daily.   12/08/2015 at Unknown time  . HYDROcodone-acetaminophen (VICODIN) 5-500 MG per tablet Take 1 tablet by mouth every 6 (six) hours as needed for pain.    12/01/2015  . magnesium 30 MG tablet Take 30 mg by mouth daily.   12/08/2015 at Unknown time  . Multiple Vitamins-Minerals (PRESERVISION AREDS) TABS Take 1 tablet by mouth 2 (two) times daily.   12/08/2015 at Unknown time  . Tamsulosin HCl (FLOMAX) 0.4 MG CAPS Take 0.4 mg by mouth daily. Reported on 12/08/2015   12/08/2015 at Unknown time    Assessment: 60 yo M with CP. Pharmacy consulted to dose heparin for ACS/STEMI.  Pt as new ascending aortic aneurysm per CTA.  Troponin negative to date.  Wt 108 kg, HDW 98.3 kg, H/H WNL but PLTC low at 138.  No bleeding reported.   Goal of Therapy:  Heparin level 0.3-0.7  units/ml Monitor platelets by anticoagulation protocol: Yes   Plan:  Give 4000 units bolus x 1 Start heparin infusion at 1250 units/hr Check anti-Xa level in 6 hours and daily while on heparin Continue to monitor H&H and platelets Heparin drip tubed from ED to [place], Pharm.D. [id number] 12/08/2015 2:20 PM

## 2015-12-08 NOTE — H&P (Signed)
[facility] History and Physical  Curtis Warren [id number] DOB: [birth year] DOA: 12/08/2015  Referring physician:  PCP: Reginia Naas, MD   Chief Complaint:  HPI: Curtis Warren is a 60 y.o. male  with no significant prior medical history presenting with acute substernal chest pain described as 10/10, sharp, constant since 8:45 this am, with radiation the back and to the left.  Pain not worsened with deep inspiration, movement or exertion. Denies any jaw pain. Denies any dizziness or falls. Symptoms were coincidental with seeing his estranged daughter for the first time in 2 years for which he is very emotional  Denies shortness of breath or cough, but he does report seasonal allergies. Denies any fever or chills. Denies any nausea, vomiting or abdominal pain. Appetite is normal and eats salt rich and fatty foods. Denies any leg swelling or calf pain. Denies any headaches or vision changes. Denies any seizures or confusion. Denies any prior history of cardiac disease.The patient never had an echocardiogram or seen by Cardiology. No recent long distance trips. Recent exertion when picking up heavy bags of mulch. No new meds. No  hormonal therapy.No new herbal supplements. Does not smoke. No ETOH or recreational drugs. Risk factors include father had CAD s/p CABG. Denies h/o DM, HTN or HLD At the ED,patient received ASA and Nitroglycerin with no significant improvement of symptoms. EKG shows NSR, QTC 413 . CMET and CBC are essentially unremarkable. Troponins are negative to date, 0.00.  Chest x-ray shows no acute findings. CT angio however although evidence of thoracic aortic dissection, a 4.0 cm ascending thoracic aortic aneurysm was noted.  No bleeding issues noted. Will admit to tele obs for further evaluation.   Review of Systems: See HPI for significant positives. All other systems were reviewed and are negative.  Past Medical History  Diagnosis Date  . Back pain   .  BPH (benign prostatic hyperplasia)    Past Surgical History  Procedure Laterality Date  . Hemorrhoid surgery    . Colonoscopy    . Polypectomy     Social History:  reports that he has never smoked. He has never used smokeless tobacco. He reports that he does not drink alcohol or use illicit drugs.  No Known Allergies  Family History  Problem Relation Age of Onset  . Alcoholism Mother   . Bone cancer Father   . Heart attack Father 24  . Colon cancer Neg Hx      Prior to Admission medications   Medication Sig Start Date End Date Taking? Authorizing Provider  cetirizine (ZYRTEC) 10 MG tablet Take 10 mg by mouth daily.   Yes Historical Provider, MD  Coenzyme Q10 (CO Q 10 PO) Take 1 tablet by mouth daily.   Yes Historical Provider, MD  Cyanocobalamin (VITAMIN B 12 PO) Take 1 tablet by mouth daily.   Yes Historical Provider, MD  finasteride (PROSCAR) 5 MG tablet Take 5 mg by mouth daily.   Yes Historical Provider, MD  HYDROcodone-acetaminophen (VICODIN) 5-500 MG per tablet Take 1 tablet by mouth every 6 (six) hours as needed for pain.    Yes Historical Provider, MD  magnesium 30 MG tablet Take 30 mg by mouth daily.   Yes Historical Provider, MD  Multiple Vitamins-Minerals (PRESERVISION AREDS) TABS Take 1 tablet by mouth 2 (two) times daily.   Yes Historical Provider, MD  Tamsulosin HCl (FLOMAX) 0.4 MG CAPS Take 0.4 mg by mouth daily. Reported on 12/08/2015   Yes Historical Provider, MD  Physical Exam: Filed Vitals:   12/08/15 1230 12/08/15 1245 12/08/15 1300 12/08/15 1345  BP: 134/98 137/83 142/91 148/98  Pulse: 71 79 77 84  Temp:      TempSrc:      Resp: 15 22 23 17   Height:      Weight:      SpO2: 96% 95% 98% 96%    Wt Readings from Last 3 Encounters:  12/08/15 107.956 kg (238 lb)  10/12/12 102.513 kg (226 lb)  09/29/12 102.785 kg (226 lb 9.6 oz)    General: Appears anxius and uncomfortable Eyes:  PERRL, EOMI, normal lids, iris ENT: grossly normal hearing, lips &  tongue Neck: no lymphadenopathy, masses or thyromegaly Cardiovascular: regular rate and rythm, no murmurs, rubs or gallops. No lower extremity edema   Respiratory: clear to auscultation bilaterally, no wheezing, rhonhci or rales. Normal respiratory effort. Abdomen: soft,non-tender, normal bowel sounds Skin: no rash or induration seen on limited exam. No open lesions. Musculoskeletal:  grossly normal tone in both upper and lower extremities Psychiatric: grossly normal mood and affect, speech fluent and appropriate Neurologic: CN 2-12 grossly intact, moves all extremities in coordinated fashion.          Labs on Admission:  Basic Metabolic Panel:  Recent Labs Lab 12/08/15 1024  NA 139  K 4.9  CL 107  CO2 22  GLUCOSE 121*  BUN 15  CREATININE 1.23  CALCIUM 9.2    Liver Function Tests:  Recent Labs Lab 12/08/15 1024  AST 37  ALT 51  ALKPHOS 97  BILITOT 1.0  PROT 6.8  ALBUMIN 3.8   No results for input(s): LIPASE, AMYLASE in the last 168 hours. No results for input(s): AMMONIA in the last 168 hours.  CBC:  Recent Labs Lab 12/08/15 1024  WBC 8.5  NEUTROABS 5.9  HGB 15.7  HCT 47.3  MCV 91.0  PLT 138*    Cardiac Enzymes: No results for input(s): CKTOTAL, CKMB, CKMBINDEX, TROPONINI in the last 168 hours.  BNP (last 3 results) No results for input(s): BNP in the last 8760 hours.  ProBNP (last 3 results) No results for input(s): PROBNP in the last 8760 hours.   CREATININE: 1.23 (12/08/15 1024) Estimated creatinine clearance - 78.9 mL/min  CBG: No results for input(s): GLUCAP in the last 168 hours.  Radiological Exams on Admission: Dg Chest 2 View  12/08/2015  CLINICAL DATA:  Left side chest pain starting this morning, sleep apnea EXAM: CHEST  2 VIEW COMPARISON:  None. FINDINGS: Cardiomediastinal silhouette is unremarkable. No acute infiltrate or pleural effusion. No pulmonary edema. Bony thorax is unremarkable. IMPRESSION: No active cardiopulmonary  disease. Electronically Signed   By: Lahoma Crocker M.D.   On: 12/08/2015 11:03   Ct Angio Chest Aorta W/cm &/or Wo/cm  12/08/2015  CLINICAL DATA:  Acute chest pain. EXAM: CT ANGIOGRAPHY CHEST WITH CONTRAST TECHNIQUE: Multidetector CT imaging of the chest was performed using the standard protocol during bolus administration of intravenous contrast. Multiplanar CT image reconstructions and MIPs were obtained to evaluate the vascular anatomy. CONTRAST:  80 mL of Isovue 370 intravenously. COMPARISON:  Chest radiograph same day. FINDINGS: No pneumothorax or pleural effusion is noted. No acute pulmonary disease is noted. Visualized portion of upper abdomen is unremarkable. There is no evidence of thoracic aortic dissection. 4.0 cm ascending thoracic aortic aneurysm is noted. No mediastinal mass or adenopathy is noted. 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 2.7 cm descending thoracic aorta is noted. No significant osseous abnormality is noted. Review  of the MIP images confirms the above findings. IMPRESSION: No evidence of thoracic aortic dissection. 4.0 cm ascending thoracic aortic aneurysm is noted. Recommend annual imaging followup by CTA or MRA. This recommendation follows 2010 ACCF/AHA/AATS/ACR/ASA/SCA/SCAI/SIR/STS/SVM Guidelines for the Diagnosis and Management of Patients with Thoracic Aortic Disease. Circulation. 2010; [id number]:[id number]. Electronically Signed   By: Marijo Conception, M.D.   On: 12/08/2015 12:12    EKG: Independently reviewed.    Assessment/Plan Principal Problem:   Chest pain Active Problems:   Ascending aortic aneurysm (HCC)   Chest pain syndrome, atypical with new Ascending aortic aneurysm per CTA . EKG shows NSR, QTC 413  CMET and CBC are essentially normal. Troponins are negative to date, 0.00. CXR negative. BP 148/98 mmHg  Pulse 84  Temp(Src) 98.6 F (37 C) (Oral)  Resp 17  Ht 5\' 11"  (1.803 m)  Wt 107.956 kg (238 lb)  BMI 33.21 kg/m2  SpO2 96% -Admit to Telemetry/  Observation Cardiology consult Cycle troponin EKG in am -morphine, nitroglycerin continue ASA, O2 and NTG as needed May need to start on BB Lipid panel Hb A1C GI cocktail Will need to have outpatient TCTS evaluation regarding new Aortic Ascending aneurysm finding   OSA on CPAP Continue CPAP  Code Status: Full Code DVT Prophylaxis: Heparin per Pharmacy Family Communication:  Family at bedside Disposition Plan: Pending Improvement. Admitted for observation in tele bed. Expected LOS 24-48 hrs    [facility] E,PA-C [facility] [email or web address] Password [email or web address]

## 2015-12-08 NOTE — ED Notes (Signed)
Patient transported to CT 

## 2015-12-08 NOTE — ED Provider Notes (Signed)
CSN: [id number]     Arrival date & time 12/08/15  [id number] History   First MD Initiated Contact with Patient 12/08/15 1017     Chief Complaint  Patient presents with  . Chest Pain     (Consider location/radiation/quality/duration/timing/severity/associated sxs/prior Treatment) HPI   Curtis Warren is a 60 y.o. male, with a history of BPH, presenting to the ED with an episode of chest pain that occurred around 0845 this morning. Pt was carrying the garbage out when he had sudden onset retrosternal, sharp chest pain, initially rated 10/10, reduced to 1/10 now after NTG, radiating through to his back and then down his left arm. Pt received 324 mg ASA and 1 NTG with EMS. Denies erectile dysfunction medications. Patient states that he did not want EMS called, but his wife called anyway. Patient is accompanied by his wife at the bedside. Wife states that she suspects that the patient is in more pain than he is letting on. Denies N/V, cough/recent illness, fever/chills, diaphoresis, shortness of breath, or any other complaints.     Past Medical History  Diagnosis Date  . Back pain   . BPH (benign prostatic hyperplasia)    Past Surgical History  Procedure Laterality Date  . Hemorrhoid surgery    . Colonoscopy    . Polypectomy     Family History  Problem Relation Age of Onset  . Alcoholism Mother   . Bone cancer Father   . Heart attack Father 75  . Colon cancer Neg Hx    Social History  Substance Use Topics  . Smoking status: Never Smoker   . Smokeless tobacco: Never Used  . Alcohol Use: No    Review of Systems  Constitutional: Negative for fever, chills and diaphoresis.  Respiratory: Negative for cough and shortness of breath.   Cardiovascular: Positive for chest pain. Negative for palpitations and leg swelling.  Gastrointestinal: Negative for nausea, vomiting and abdominal pain.  Skin: Negative for color change and pallor.  Neurological: Negative for dizziness, light-headedness  and headaches.  All other systems reviewed and are negative.     Allergies  Review of patient's allergies indicates no known allergies.  Home Medications   Prior to Admission medications   Medication Sig Start Date End Date Taking? Authorizing Provider  cetirizine (ZYRTEC) 10 MG tablet Take 10 mg by mouth daily.   Yes Historical Provider, MD  Coenzyme Q10 (CO Q 10 PO) Take 1 tablet by mouth daily.   Yes Historical Provider, MD  Cyanocobalamin (VITAMIN B 12 PO) Take 1 tablet by mouth daily.   Yes Historical Provider, MD  finasteride (PROSCAR) 5 MG tablet Take 5 mg by mouth daily.   Yes Historical Provider, MD  HYDROcodone-acetaminophen (VICODIN) 5-500 MG per tablet Take 1 tablet by mouth every 6 (six) hours as needed for pain.    Yes Historical Provider, MD  magnesium 30 MG tablet Take 30 mg by mouth daily.   Yes Historical Provider, MD  Multiple Vitamins-Minerals (PRESERVISION AREDS) TABS Take 1 tablet by mouth 2 (two) times daily.   Yes Historical Provider, MD  Tamsulosin HCl (FLOMAX) 0.4 MG CAPS Take 0.4 mg by mouth daily. Reported on 12/08/2015   Yes Historical Provider, MD   BP 137/83 mmHg  Pulse 79  Temp(Src) 98.6 F (37 C) (Oral)  Resp 22  Ht 5\' 11"  (1.803 m)  Wt 107.956 kg  BMI 33.21 kg/m2  SpO2 95% Physical Exam  Constitutional: He appears well-developed and well-nourished. No distress.  HENT:  Head: Normocephalic and atraumatic.  Eyes: Conjunctivae are normal. Pupils are equal, round, and reactive to light.  Neck: Neck supple.  Cardiovascular: Normal rate, regular rhythm, normal heart sounds and intact distal pulses.   Pulmonary/Chest: Effort normal and breath sounds normal. No respiratory distress.  Abdominal: Soft. There is no tenderness. There is no guarding.  Musculoskeletal: He exhibits no edema or tenderness.  Lymphadenopathy:    He has no cervical adenopathy.  Neurological: He is alert.  Skin: Skin is warm and dry. He is not diaphoretic.  Psychiatric: He has  a normal mood and affect. His behavior is normal.  Nursing note and vitals reviewed.   ED Course  Procedures (including critical care time) Labs Review Labs Reviewed  APTT - Abnormal; Notable for the following:    aPTT 39 (*)    All other components within normal limits  COMPREHENSIVE METABOLIC PANEL - Abnormal; Notable for the following:    Glucose, Bld 121 (*)    All other components within normal limits  CBC WITH DIFFERENTIAL/PLATELET - Abnormal; Notable for the following:    Platelets 138 (*)    Monocytes Absolute 1.4 (*)    All other components within normal limits  PROTIME-INR  I-STAT TROPOININ, ED  Randolm Idol, ED    Imaging Review Dg Chest 2 View  12/08/2015  CLINICAL DATA:  Left side chest pain starting this morning, sleep apnea EXAM: CHEST  2 VIEW COMPARISON:  None. FINDINGS: Cardiomediastinal silhouette is unremarkable. No acute infiltrate or pleural effusion. No pulmonary edema. Bony thorax is unremarkable. IMPRESSION: No active cardiopulmonary disease. Electronically Signed   By: Lahoma Crocker M.D.   On: 12/08/2015 11:03   Ct Angio Chest Aorta W/cm &/or Wo/cm  12/08/2015  CLINICAL DATA:  Acute chest pain. EXAM: CT ANGIOGRAPHY CHEST WITH CONTRAST TECHNIQUE: Multidetector CT imaging of the chest was performed using the standard protocol during bolus administration of intravenous contrast. Multiplanar CT image reconstructions and MIPs were obtained to evaluate the vascular anatomy. CONTRAST:  80 mL of Isovue 370 intravenously. COMPARISON:  Chest radiograph same day. FINDINGS: No pneumothorax or pleural effusion is noted. No acute pulmonary disease is noted. Visualized portion of upper abdomen is unremarkable. There is no evidence of thoracic aortic dissection. 4.0 cm ascending thoracic aortic aneurysm is noted. No mediastinal mass or adenopathy is noted. 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 2.7 cm descending thoracic aorta is noted. No significant osseous abnormality is  noted. Review of the MIP images confirms the above findings. IMPRESSION: No evidence of thoracic aortic dissection. 4.0 cm ascending thoracic aortic aneurysm is noted. Recommend annual imaging followup by CTA or MRA. This recommendation follows 2010 ACCF/AHA/AATS/ACR/ASA/SCA/SCAI/SIR/STS/SVM Guidelines for the Diagnosis and Management of Patients with Thoracic Aortic Disease. Circulation. 2010; [id number]:5694362. Electronically Signed   By: Marijo Conception, M.D.   On: 12/08/2015 12:12   I have personally reviewed and evaluated these images and lab results as part of my medical decision-making.   EKG Interpretation   Date/Time:  Friday December 08 2015 10:06:52 EDT Ventricular Rate:  71 PR Interval:  172 QRS Duration: 100 QT Interval:  380 QTC Calculation: 413 R Axis:   32 Text Interpretation:  Sinus rhythm Non-specific ST-t changes No old  tracing to compare Reconfirmed by KOHUT  MD, [place] ([id number]) on 12/08/2015  10:32:37 AM      MDM   Final diagnoses:  Chest pain, unspecified chest pain type    Annabelle Harman presents with sudden onset chest pain that  began earlier this morning.  Findings and plan of care discussed with Virgel Manifold, MD. Dr. Wilson Singer personally evaluated and examined this patient.  This patient's presentation is suspicious for ACS versus thoracic aortic dissection. HEART score is 4, indicating moderate risk for a cardiac event. Wells criteria score is 0, indicating low risk for PE. Patient's imaging and labs are free from significant acute abnormalities. Incidental finding of 4 cm thoracic aortic aneurysm with no signs of dissection. It is unlikely that this is causing the patient's discomfort. Patient's presentation and story is suspicious enough to warrant chest pain observation. This plan of care was presented with patient, who states he is comfortable with this plan. 1:03 PM Spoke with Sherrilyn Rist, PA for [facility], who agreed to admit the patient to  telemetry observation under Dr. Waldron Labs. Further instructions. Temporary admission orders placed.  Filed Vitals:   12/08/15 1203 12/08/15 1215 12/08/15 1230 12/08/15 1245  BP: 125/81 117/78 134/98 137/83  Pulse: 73 70 71 79  Temp:      TempSrc:      Resp: 17 25 15 22   Height:      Weight:      SpO2: 93% 99% 96% 95%   Filed Vitals:   12/08/15 1245 12/08/15 1300 12/08/15 1345 12/08/15 1426  BP: 137/83 142/91 148/98 137/92  Pulse: 79 77 84 101  Temp:    98.7 F (37.1 C)  TempSrc:    Oral  Resp: 22 23 17    Height:      Weight:      SpO2: 95% 98% 96% 95%     Lorayne Bender, PA-C 12/08/15 1809  Virgel Manifold, MD 12/10/15 1545

## 2015-12-09 ENCOUNTER — Observation Stay (HOSPITAL_BASED_OUTPATIENT_CLINIC_OR_DEPARTMENT_OTHER): Payer: 59

## 2015-12-09 ENCOUNTER — Observation Stay (HOSPITAL_COMMUNITY): Payer: Self-pay

## 2015-12-09 DIAGNOSIS — R079 Chest pain, unspecified: Secondary | ICD-10-CM

## 2015-12-09 DIAGNOSIS — I712 Thoracic aortic aneurysm, without rupture: Secondary | ICD-10-CM | POA: Diagnosis not present

## 2015-12-09 LAB — ECHOCARDIOGRAM COMPLETE
Height: 71 in
WEIGHTICAEL: 3808 [oz_av]

## 2015-12-09 LAB — CBC
HEMATOCRIT: 46.4 % (ref 39.0–52.0)
HEMOGLOBIN: 15.2 g/dL (ref 13.0–17.0)
MCH: 30 pg (ref 26.0–34.0)
MCHC: 32.8 g/dL (ref 30.0–36.0)
MCV: 91.5 fL (ref 78.0–100.0)
Platelets: 143 10*3/uL — ABNORMAL LOW (ref 150–400)
RBC: 5.07 MIL/uL (ref 4.22–5.81)
RDW: 12.9 % (ref 11.5–15.5)
WBC: 7.8 10*3/uL (ref 4.0–10.5)

## 2015-12-09 LAB — LIPID PANEL
CHOLESTEROL: 137 mg/dL (ref 0–200)
HDL: 30 mg/dL — AB (ref >40)
LDL CALC: 93 mg/dL (ref 0–99)
TRIGLYCERIDES: 70 mg/dL (ref <150)
Total CHOL/HDL Ratio: 4.6 RATIO
VLDL: 14 mg/dL (ref 0–40)

## 2015-12-09 LAB — NM MYOCAR MULTI W/SPECT W/WALL MOTION / EF
CHL RATE OF PERCEIVED EXERTION: 18
CSEPED: 5 min
CSEPHR: 90 %
CSEPPHR: 144 {beats}/min
Estimated workload: 7 METS
Exercise duration (sec): 0 s
MPHR: 159 {beats}/min
Rest HR: 89 {beats}/min

## 2015-12-09 LAB — HEMOGLOBIN A1C
Hgb A1c MFr Bld: 5.9 % — ABNORMAL HIGH (ref 4.8–5.6)
Mean Plasma Glucose: 123 mg/dL

## 2015-12-09 LAB — HEPARIN LEVEL (UNFRACTIONATED): HEPARIN UNFRACTIONATED: 0.43 [IU]/mL (ref 0.30–0.70)

## 2015-12-09 MED ORDER — TECHNETIUM TC 99M SESTAMIBI GENERIC - CARDIOLITE
30.0000 | Freq: Once | INTRAVENOUS | Status: AC | PRN
  Administered 2015-12-09: 30 via INTRAVENOUS

## 2015-12-09 MED ORDER — METOPROLOL TARTRATE 25 MG PO TABS
25.0000 mg | ORAL_TABLET | Freq: Two times a day (BID) | ORAL | Status: DC
  Administered 2015-12-09: 25 mg via ORAL
  Filled 2015-12-09: qty 1

## 2015-12-09 MED ORDER — TECHNETIUM TC 99M SESTAMIBI GENERIC - CARDIOLITE
10.0000 | Freq: Once | INTRAVENOUS | Status: AC | PRN
  Administered 2015-12-09: 10 via INTRAVENOUS

## 2015-12-09 MED ORDER — METOPROLOL TARTRATE 25 MG PO TABS: 12.5000 mg | ORAL_TABLET | Freq: Two times a day (BID) | ORAL | Status: DC

## 2015-12-09 NOTE — Progress Notes (Signed)
Exercise myoview portion of nuc completed without complications. nuc results to follow.

## 2015-12-09 NOTE — Progress Notes (Signed)
Patient discharged from the facility. Discharge instructions completed. All questions answered. Pt prescription for metoprolol wasn't available during D/c. Text paged Dr. Donnal Debar. Per MD will let Dr. Wendee Beavers know in the AM and will call prescription to the pharmacy. Pt. And family requested for prescription to be fax at [facility] -- [place], [place].  Rich Number RN

## 2015-12-09 NOTE — Progress Notes (Signed)
SUBJECTIVE:  No complaints.  No further CP  OBJECTIVE:   Vitals:   Filed Vitals:   12/08/15 1345 12/08/15 1426 12/08/15 2122 12/09/15 0408  BP: 148/98 137/92 110/71 147/92  Pulse: 84 101 85 77  Temp:  98.7 F (37.1 C) 98.8 F (37.1 C) 98.6 F (37 C)  TempSrc:  Oral Oral Oral  Resp: 17  18 18   Height:      Weight:      SpO2: 96% 95% 98% 97%   I&O's:   Intake/Output Summary (Last 24 hours) at 12/09/15 0749 Last data filed at 12/09/15 0409  Gross per 24 hour  Intake    240 ml  Output    800 ml  Net   -560 ml   TELEMETRY: Reviewed telemetry pt in NSR:     PHYSICAL EXAM General: Well developed, well nourished, in no acute distress Head: Eyes PERRLA, No xanthomas.   Normal cephalic and atramatic  Lungs:   Clear bilaterally to auscultation and percussion. Heart:   HRRR S1 S2 Pulses are 2+ & equal. Abdomen: Bowel sounds are positive, abdomen soft and non-tender without masses Extremities:   No clubbing, cyanosis or edema.  DP +1 Neuro: Alert and oriented X 3. Psych:  Good affect, responds appropriately   LABS: Basic Metabolic Panel:  Recent Labs  12/08/15 1024  NA 139  K 4.9  CL 107  CO2 22  GLUCOSE 121*  BUN 15  CREATININE 1.23  CALCIUM 9.2   Liver Function Tests:  Recent Labs  12/08/15 1024  AST 37  ALT 51  ALKPHOS 97  BILITOT 1.0  PROT 6.8  ALBUMIN 3.8   No results for input(s): LIPASE, AMYLASE in the last 72 hours. CBC:  Recent Labs  12/08/15 1024 12/09/15 0502  WBC 8.5 7.8  NEUTROABS 5.9  --   HGB 15.7 15.2  HCT 47.3 46.4  MCV 91.0 91.5  PLT 138* 143*   Cardiac Enzymes: No results for input(s): CKTOTAL, CKMB, CKMBINDEX, TROPONINI in the last 72 hours. BNP: Invalid input(s): POCBNP D-Dimer: No results for input(s): DDIMER in the last 72 hours. Hemoglobin A1C:  Recent Labs  12/08/15 1024  HGBA1C 5.9*   Fasting Lipid Panel:  Recent Labs  12/09/15 0502  CHOL 137  HDL 30*  LDLCALC 93  TRIG 70  CHOLHDL 4.6   Thyroid  Function Tests: No results for input(s): TSH, T4TOTAL, T3FREE, THYROIDAB in the last 72 hours.  Invalid input(s): FREET3 Anemia Panel: No results for input(s): VITAMINB12, FOLATE, FERRITIN, TIBC, IRON, RETICCTPCT in the last 72 hours. Coag Panel:   Lab Results  Component Value Date   INR 1.06 12/08/2015    RADIOLOGY: Dg Chest 2 View  12/08/2015  CLINICAL DATA:  Left side chest pain starting this morning, sleep apnea EXAM: CHEST  2 VIEW COMPARISON:  None. FINDINGS: Cardiomediastinal silhouette is unremarkable. No acute infiltrate or pleural effusion. No pulmonary edema. Bony thorax is unremarkable. IMPRESSION: No active cardiopulmonary disease. Electronically Signed   By: Lahoma Crocker M.D.   On: 12/08/2015 11:03   Ct Angio Chest Aorta W/cm &/or Wo/cm  12/08/2015  CLINICAL DATA:  Acute chest pain. EXAM: CT ANGIOGRAPHY CHEST WITH CONTRAST TECHNIQUE: Multidetector CT imaging of the chest was performed using the standard protocol during bolus administration of intravenous contrast. Multiplanar CT image reconstructions and MIPs were obtained to evaluate the vascular anatomy. CONTRAST:  80 mL of Isovue 370 intravenously. COMPARISON:  Chest radiograph same day. FINDINGS: No pneumothorax or pleural effusion is  noted. No acute pulmonary disease is noted. Visualized portion of upper abdomen is unremarkable. There is no evidence of thoracic aortic dissection. 4.0 cm ascending thoracic aortic aneurysm is noted. No mediastinal mass or adenopathy is noted. 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 2.7 cm descending thoracic aorta is noted. No significant osseous abnormality is noted. Review of the MIP images confirms the above findings. IMPRESSION: No evidence of thoracic aortic dissection. 4.0 cm ascending thoracic aortic aneurysm is noted. Recommend annual imaging followup by CTA or MRA. This recommendation follows 2010 ACCF/AHA/AATS/ACR/ASA/SCA/SCAI/SIR/STS/SVM Guidelines for the Diagnosis and Management of Patients  with Thoracic Aortic Disease. Circulation. 2010; [id number]. Electronically Signed   By: Marijo Conception, M.D.   On: 12/08/2015 12:12    ASSESSMENT AND PLAN  1. Chest pain at rest - CT of the chest was negative for aortic dissection, however did show 4 cm ascending aortic aneurysm, this will need to be monitored as outpatient. - Overall atypical features  - troponin negative x 2  -  treadmill Myoview today and if negative no further workup for ischemia but will need outpt followup with Dr. Meda Coffee in regards to aortic aneurysm.    2.  HTN - BP improved continue BB  Signed,   Sueanne Margarita, MD  12/09/2015  7:49 AM

## 2015-12-09 NOTE — Progress Notes (Signed)
Dr. Wendee Beavers called this RN, inquired if cardiology had been by. Replied no, stated Curtis Kicks, NP had called and asked the same question. Stated she was coming by to update the wife and patient before she left this afternoon. Gave Curtis Warren contact # - states depending on her recommendations, he will write d/c orders now or in the AM. Informed patient and wife.

## 2015-12-09 NOTE — Progress Notes (Signed)
ANTICOAGULATION CONSULT NOTE - Follow Up Consult  Pharmacy Consult for Heparin Indication: chest pain/ACS  No Known Allergies  Patient Measurements: Height: 5\' 11"  (180.3 cm) Weight: 238 lb (107.956 kg) IBW/kg (Calculated) : 75.3 Heparin Dosing Weight: 98kg  Vital Signs: Temp: 98.6 F (37 C) (04/08 0408) Temp Source: Oral (04/08 0408) BP: 184/89 mmHg (04/08 1010) Pulse Rate: 98 (04/08 1010)  Labs:  Recent Labs  12/08/15 1024 12/08/15 2030 12/09/15 0502  HGB 15.7  --  15.2  HCT 47.3  --  46.4  PLT 138*  --  143*  APTT 39*  --   --   LABPROT 14.0  --   --   INR 1.06  --   --   HEPARINUNFRC  --  0.33 0.43  CREATININE 1.23  --   --     Estimated Creatinine Clearance: 78.9 mL/min (by C-G formula based on Cr of 1.23).   Medications:  Heparin @ 1350 units/hr  Assessment: 60yom continues on heparin for r/o ACS. Heparin level therapeutic at 0.43.Treadmill myoview today. CBC stable. No bleeding reported.   Goal of Therapy:  Heparin level 0.3-0.7 units/ml Monitor platelets by anticoagulation protocol: Yes   Plan:  1) Continue heparin at 1350 units/hr 2) Follow up myoview results  Deboraha Sprang 12/09/2015,11:06 AM

## 2015-12-09 NOTE — Progress Notes (Signed)
  Echocardiogram 2D Echocardiogram has been performed.  Curtis Warren 12/09/2015, 1:34 PM

## 2015-12-09 NOTE — Discharge Summary (Signed)
Physician Discharge Summary  Curtis Warren [id number] DOB: 09/23/1955 DOA: 12/08/2015  PCP: Reginia Naas, MD  Admit date: 12/08/2015 Discharge date: 12/09/2015  Time spent: > 35 minutes  Recommendations for Outpatient Follow-up:  1. Patient has 4.0 cm ascending thoracic aortic aneurysm that needs follow up.   Discharge Diagnoses:  Principal Problem:   Chest pain Active Problems:   Ascending aortic aneurysm [facility])   Discharge Condition: stable  Diet recommendation: Heart healthy  Filed Weights   12/08/15 1006  Weight: 107.956 kg (238 lb)    History of present illness:  From original HPI: 60 year old male presents with complaints of chest pain, CTA chest negative for PE, but significant for ascending aortic aneurysm, cardiology consulted, plan for stress test in a.m.  Hospital Course:  Chest pain - etiology uncertain. CTA chest negative for PE. Cardiac work up negative. - troponin negative - chest x ray reports no active cardiopulmonary disease - Nuclear stress test reports no reversible ischemia or infarction, normal left ventricular wall motion, and low rist stress test findings. - Pt overnight was given mucinex overnight for congestion. This may be related to Upper respiratory viral infection.  Procedures:  Please see above  Consultations:  Cardiology  Discharge Exam: Filed Vitals:   12/09/15 1010 12/09/15 1449  BP: 184/89 139/72  Pulse: 98 90  Temp:    Resp:      General: Pt in nad, alert and awake Cardiovascular: no cyanosis Respiratory: no increased wob, no wheezes  Discharge Instructions   Discharge Instructions    Call MD for:  difficulty breathing, headache or visual disturbances    Complete by:  As directed      Call MD for:  severe uncontrolled pain    Complete by:  As directed      Call MD for:  temperature >100.4    Complete by:  As directed      Diet - low sodium heart healthy    Complete by:  As directed      Discharge  instructions    Complete by:  As directed   As per Cardiology recommendations please follow up with outpt followup with Dr. Meda Coffee in regards to aortic aneurysm.   Also follow up with your primary care physician for further evaluation and recommendations into your recent chest discomfort.     Increase activity slowly    Complete by:  As directed           Current Discharge Medication List    START taking these medications   Details  metoprolol tartrate (LOPRESSOR) 25 MG tablet Take 0.5 tablets (12.5 mg total) by mouth 2 (two) times daily. Qty: 30 tablet, Refills: 0      CONTINUE these medications which have NOT CHANGED   Details  cetirizine (ZYRTEC) 10 MG tablet Take 10 mg by mouth daily.    Coenzyme Q10 (CO Q 10 PO) Take 1 tablet by mouth daily.    Cyanocobalamin (VITAMIN B 12 PO) Take 1 tablet by mouth daily.    finasteride (PROSCAR) 5 MG tablet Take 5 mg by mouth daily.    HYDROcodone-acetaminophen (VICODIN) 5-500 MG per tablet Take 1 tablet by mouth every 6 (six) hours as needed for pain.     magnesium 30 MG tablet Take 30 mg by mouth daily.    Multiple Vitamins-Minerals (PRESERVISION AREDS) TABS Take 1 tablet by mouth 2 (two) times daily.    Tamsulosin HCl (FLOMAX) 0.4 MG CAPS Take 0.4 mg by mouth daily. Reported on 12/08/2015  No Known Allergies    The results of significant diagnostics from this hospitalization (including imaging, microbiology, ancillary and laboratory) are listed below for reference.    Significant Diagnostic Studies: Dg Chest 2 View  12/08/2015  CLINICAL DATA:  Left side chest pain starting this morning, sleep apnea EXAM: CHEST  2 VIEW COMPARISON:  None. FINDINGS: Cardiomediastinal silhouette is unremarkable. No acute infiltrate or pleural effusion. No pulmonary edema. Bony thorax is unremarkable. IMPRESSION: No active cardiopulmonary disease. Electronically Signed   By: Lahoma Crocker M.D.   On: 12/08/2015 11:03   Nm Myocar Multi W/spect  W/wall Motion / Ef  12/09/2015  CLINICAL DATA:  60 year old with chest pain. EXAM: MYOCARDIAL IMAGING WITH SPECT (REST AND EXERCISE) GATED LEFT VENTRICULAR WALL MOTION STUDY LEFT VENTRICULAR EJECTION FRACTION TECHNIQUE: Standard myocardial SPECT imaging was performed after resting intravenous injection of 10 mCi Tc-69m sestamibi. Subsequently, exercise tolerance test was performed by the patient under the supervision of the Cardiology staff. At peak-stress, 30 mCi Tc-51m sestamibi was injected intravenously and standard myocardial SPECT imaging was performed. Quantitative gated imaging was also performed to evaluate left ventricular wall motion, and estimate left ventricular ejection fraction. COMPARISON:  None. FINDINGS: Perfusion: No decreased activity in the left ventricle on stress imaging to suggest reversible ischemia or infarction. Wall Motion: Normal left ventricular wall motion. No left ventricular dilation. Left Ventricular Ejection Fraction: 59 % End diastolic volume 78 ml End systolic volume 32 ml IMPRESSION: 1. No reversible ischemia or infarction. 2. Normal left ventricular wall motion. 3. Left ventricular ejection fraction is 59%. 4. Low-risk stress test findings*. *2012 Appropriate Use Criteria for Coronary Revascularization Focused Update: J Am Coll Cardiol. [id number]. http://content.airportbarriers.com.aspx?articleid=[email or web address] Electronically Signed   By: Markus Daft M.D.   On: 12/09/2015 11:47   Ct Angio Chest Aorta W/cm &/or Wo/cm  12/08/2015  CLINICAL DATA:  Acute chest pain. EXAM: CT ANGIOGRAPHY CHEST WITH CONTRAST TECHNIQUE: Multidetector CT imaging of the chest was performed using the standard protocol during bolus administration of intravenous contrast. Multiplanar CT image reconstructions and MIPs were obtained to evaluate the vascular anatomy. CONTRAST:  80 mL of Isovue 370 intravenously. COMPARISON:  Chest radiograph same day. FINDINGS: No pneumothorax or pleural effusion is  noted. No acute pulmonary disease is noted. Visualized portion of upper abdomen is unremarkable. There is no evidence of thoracic aortic dissection. 4.0 cm ascending thoracic aortic aneurysm is noted. No mediastinal mass or adenopathy is noted. 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 2.7 cm descending thoracic aorta is noted. No significant osseous abnormality is noted. Review of the MIP images confirms the above findings. IMPRESSION: No evidence of thoracic aortic dissection. 4.0 cm ascending thoracic aortic aneurysm is noted. Recommend annual imaging followup by CTA or MRA. This recommendation follows 2010 ACCF/AHA/AATS/ACR/ASA/SCA/SCAI/SIR/STS/SVM Guidelines for the Diagnosis and Management of Patients with Thoracic Aortic Disease. Circulation. 2010; 121SP:[id number]. Electronically Signed   By: Marijo Conception, M.D.   On: 12/08/2015 12:12    Microbiology: No results found for this or any previous visit (from the past 240 hour(s)).   Labs: Basic Metabolic Panel:  Recent Labs Lab 12/08/15 1024  NA 139  K 4.9  CL 107  CO2 22  GLUCOSE 121*  BUN 15  CREATININE 1.23  CALCIUM 9.2   Liver Function Tests:  Recent Labs Lab 12/08/15 1024  AST 37  ALT 51  ALKPHOS 97  BILITOT 1.0  PROT 6.8  ALBUMIN 3.8   No results for input(s): LIPASE, AMYLASE in the last 168  hours. No results for input(s): AMMONIA in the last 168 hours. CBC:  Recent Labs Lab 12/08/15 1024 12/09/15 0502  WBC 8.5 7.8  NEUTROABS 5.9  --   HGB 15.7 15.2  HCT 47.3 46.4  MCV 91.0 91.5  PLT 138* 143*   Cardiac Enzymes: No results for input(s): CKTOTAL, CKMB, CKMBINDEX, TROPONINI in the last 168 hours. BNP: BNP (last 3 results) No results for input(s): BNP in the last 8760 hours.  ProBNP (last 3 results) No results for input(s): PROBNP in the last 8760 hours.  CBG: No results for input(s): GLUCAP in the last 168 hours.   Signed:  Velvet Bathe MD.  [facility] 12/09/2015, 5:03 PM

## 2015-12-09 NOTE — Progress Notes (Signed)
Patient's wife asking multiple questions [over at 55 minutes] regarding results of stress test, why ECHO w/o discussing need for one first, implications of aortic aneurism and when would surgery be indicated. This RN multiple times indicating will let MD know of questions. Young adult son in the room, repeats and reinforces his mother's questions. Wife is a neonatal NP for 20 years and worked [no longer] at [employer]. Will tx page cardiology to inform wife has multiple questions

## 2015-12-11 NOTE — Progress Notes (Signed)
[place] with verbal order for metoprolol.  Attempted to call patient at [phone] on 12/09/15, 12/10/15 and 12/11/15 without success. Asked pharmacy to contact patient. Payton Emerald, RN

## 2015-12-19 ENCOUNTER — Ambulatory Visit (INDEPENDENT_AMBULATORY_CARE_PROVIDER_SITE_OTHER): Payer: Self-pay | Admitting: Cardiology

## 2015-12-19 ENCOUNTER — Encounter: Payer: Self-pay | Admitting: Cardiology

## 2015-12-19 VITALS — BP 118/78 | HR 62 | Ht 71.0 in | Wt 237.0 lb

## 2015-12-19 DIAGNOSIS — R072 Precordial pain: Secondary | ICD-10-CM

## 2015-12-19 DIAGNOSIS — I7121 Aneurysm of the ascending aorta, without rupture: Secondary | ICD-10-CM

## 2015-12-19 DIAGNOSIS — I119 Hypertensive heart disease without heart failure: Secondary | ICD-10-CM

## 2015-12-19 DIAGNOSIS — I712 Thoracic aortic aneurysm, without rupture: Secondary | ICD-10-CM

## 2015-12-19 NOTE — Patient Instructions (Signed)
Medication Instructions:   Your physician recommends that you continue on your current medications as directed. Please refer to the Current Medication list given to you today.    Labwork:  PRIOR TO YOUR ONE YEAR FOLLOW-UP APPOINTMENT WITH DR Meda Coffee TO CHECK --CMET AND LIPIDS--PLEASE COME FASTING TO THIS LAB APPOINTMENT    Testing/Procedures:  Your physician has requested that you have an echocardiogram. Echocardiography is a painless test that uses sound waves to create images of your heart. It provides your doctor with information about the size and shape of your heart and how well your heart's chambers and valves are working. This procedure takes approximately one hour. There are no restrictions for this procedure.  PLEASE HAVE THIS SCHEDULED PRIOR TO YOUR ONE YEAR FOLLOW-UP APPOINTMENT WITH DR Meda Coffee    Follow-Up:  Your physician wants you to follow-up in: [place] will receive a reminder letter in the mail two months in advance. If you don't receive a letter, please call our office to schedule the follow-up appointment.  PLEASE HAVE YOUR LABS AND ECHO DONE PRIOR TO THIS APPOINTMENT      If you need a refill on your cardiac medications before your next appointment, please call your pharmacy.

## 2015-12-19 NOTE — Progress Notes (Signed)
Patient ID: Curtis Warren, male   DOB: 1955/11/27, 60 y.o.   MRN: [id number]      Cardiology Office Note  Date:  12/19/2015   ID:  Curtis Warren, DOB [birth year], MRN [id number]  PCP:  Curtis Naas, MD  Cardiologist:   Curtis Spark, MD   Chief complain: Hospital follow up, chest pain   History of Present Illness: Curtis Warren is a 60 y.o. male who presents for posthospitalization follow up.  [name] is a pleasant obese 60 year old male WITHOUT past medical history of hypertension, hyperlipidemia, DM or any cardiac disease. He is a retired Equities trader. He does occasionally work in the yard and a cough, however does not do any strenuous activity. He has never had any chest pain past. This morning, he was taking the garbage outside, he reached down to grab the garbage bag when he started having sharp substernal chest pain radiating to the back and across the entire chest. He denies any shortness of breath, diaphoresis, or dizziness. He did not try to see if there is anything alleviates his symptom, his wife told him to sit down and called 911 immediately. EMS gave him some aspirin and nitroglycerin which immediately relieved the symptom, his chest pain completely went away after several hours and has not recurred since.  He denies any recent fever, chill, lower extremity edema, orthopnea or paroxysmal nocturnal dyspnea, he does have occasional cough which he attributes to seasonal allergies. Upon arrival in the ED, initial EKG showed no significant ST-T wave changes. Troponin was normal. He was admitted to the internal medicine service and cardiology was consulted. It appears he is estranged daughter who he has not seen for the past 3 years has showed up in the ED to visit the patient, he was very emotional from this, however also stating that the chest pain occurred before he even knew his daughter was coming to visit him.   He ruled out for ACS, he had negative  nuclear stress test for infarct or ischemia, chest CT ruled out aortic dissection and showed ascending aortic aneurysm measuring 4 cm. He was found to be significantly hypertensive and was started on metoprolol. He comes for a follow up, he is feeling better and is asymptomatic.    Past Medical History  Diagnosis Date  . Back pain   . BPH (benign prostatic hyperplasia)     Past Surgical History  Procedure Laterality Date  . Hemorrhoid surgery    . Colonoscopy    . Polypectomy       Current Outpatient Prescriptions  Medication Sig Dispense Refill  . cetirizine (ZYRTEC) 10 MG tablet Take 10 mg by mouth daily.    . Coenzyme Q10 (CO Q 10 PO) Take 1 tablet by mouth daily.    . Cyanocobalamin (VITAMIN B 12 PO) Take 1 tablet by mouth daily.    . finasteride (PROSCAR) 5 MG tablet Take 5 mg by mouth daily.    Marland Kitchen HYDROcodone-acetaminophen (VICODIN) 5-500 MG per tablet Take 1 tablet by mouth every 6 (six) hours as needed for pain.     . magnesium 30 MG tablet Take 30 mg by mouth daily.    . metoprolol tartrate (LOPRESSOR) 25 MG tablet Take 0.5 tablets (12.5 mg total) by mouth 2 (two) times daily. 30 tablet 0  . Multiple Vitamins-Minerals (PRESERVISION AREDS) TABS Take 1 tablet by mouth 2 (two) times daily.    . Tamsulosin HCl (FLOMAX) 0.4 MG CAPS Take 0.4 mg by mouth  daily. Reported on 12/08/2015     No current facility-administered medications for this visit.    Allergies:   Review of patient's allergies indicates no known allergies.    Social History:  The patient  reports that he has never smoked. He has never used smokeless tobacco. He reports that he does not drink alcohol or use illicit drugs.   Family History:  The patient's family history includes Alcoholism in his mother; Bone cancer in his father; Heart attack (age of onset: 60) in his father. There is no history of Colon cancer.    ROS:  Please see the history of present illness.  All other systems are reviewed and negative.     PHYSICAL EXAM: VS:  BP 118/78 mmHg  Pulse 62  Ht 5\' 11"  (1.803 m)  Wt 237 lb (107.502 kg)  BMI 33.07 kg/m2 , BMI Body mass index is 33.07 kg/(m^2). GEN: Well nourished, well developed, in no acute distress HEENT: normal Neck: no JVD, carotid bruits, or masses Cardiac: RRR; no murmurs, rubs, or gallops,no edema  Respiratory:  clear to auscultation bilaterally, normal work of breathing GI: soft, nontender, nondistended, + BS MS: no deformity or atrophy Skin: warm and dry, no rash Neuro:  Strength and sensation are intact Psych: euthymic mood, full affect  EKG: SR, normal ECG, unchanged from prior.  Recent Labs: 12/08/2015: ALT 51; BUN 15; Creatinine, Ser 1.23; Potassium 4.9; Sodium 139 12/09/2015: Hemoglobin 15.2; Platelets 143*   Lipid Panel    Component Value Date/Time   CHOL 137 12/09/2015 0502   TRIG 70 12/09/2015 0502   HDL 30* 12/09/2015 0502   CHOLHDL 4.6 12/09/2015 0502   VLDL 14 12/09/2015 0502   LDLCALC 93 12/09/2015 0502   Wt Readings from Last 3 Encounters:  12/19/15 237 lb (107.502 kg)  12/08/15 238 lb (107.956 kg)  10/12/12 226 lb (102.513 kg)    Other studies Reviewed: Stress test, chest CT from the hospital  TTE: 12/09/2015 Left ventricle: The cavity size was normal. Wall thickness was  increased in a pattern of mild LVH. Systolic function was  vigorous. The estimated ejection fraction was in the range of 65%  to 70%. Wall motion was normal; there were no regional wall  motion abnormalities. Doppler parameters are consistent with  abnormal left ventricular relaxation (grade 1 diastolic  dysfunction). - Left atrium: The atrium was mildly dilated.   ASSESSMENT AND PLAN:  1. Chest pain at rest - negative ischemic work up, normal ECG, no further workup needed  - CT of the chest was negative for aortic dissection  2.  Hypertensive heart disease without heart failure - BP improved continue BB  3.  Ascending aortic  aneurysm, 4 cm - upper normal for the patient's size, we will follow annually.  Follow up in 1 year, echocardiogram to be done prior to the appointment.  Signed, Curtis Spark, MD  12/19/2015 8:43 AM    [facility], [place], [place]  [address] Phone: [phone]; Fax: [phone]

## 2015-12-20 ENCOUNTER — Encounter: Payer: 59 | Admitting: Nurse Practitioner

## 2016-01-02 ENCOUNTER — Ambulatory Visit: Payer: Self-pay | Admitting: Cardiology

## 2016-01-16 ENCOUNTER — Other Ambulatory Visit: Payer: Self-pay | Admitting: *Deleted

## 2016-01-16 MED ORDER — METOPROLOL TARTRATE 25 MG PO TABS: 12.5000 mg | ORAL_TABLET | Freq: Two times a day (BID) | ORAL | Status: DC

## 2016-11-07 ENCOUNTER — Encounter: Payer: Self-pay | Admitting: Cardiology

## 2016-11-07 ENCOUNTER — Ambulatory Visit (INDEPENDENT_AMBULATORY_CARE_PROVIDER_SITE_OTHER): Payer: Self-pay | Admitting: Cardiology

## 2016-11-07 VITALS — BP 128/68 | HR 58 | Ht 71.0 in | Wt 238.0 lb

## 2016-11-07 DIAGNOSIS — I119 Hypertensive heart disease without heart failure: Secondary | ICD-10-CM

## 2016-11-07 DIAGNOSIS — I7121 Aneurysm of the ascending aorta, without rupture: Secondary | ICD-10-CM

## 2016-11-07 DIAGNOSIS — R072 Precordial pain: Secondary | ICD-10-CM

## 2016-11-07 DIAGNOSIS — I712 Thoracic aortic aneurysm, without rupture: Secondary | ICD-10-CM

## 2016-11-07 LAB — BASIC METABOLIC PANEL
BUN/Creatinine Ratio: 11 (ref 10–24)
BUN: 12 mg/dL (ref 8–27)
CO2: 22 mmol/L (ref 18–29)
Calcium: 9 mg/dL (ref 8.6–10.2)
Chloride: 101 mmol/L (ref 96–106)
Creatinine, Ser: 1.11 mg/dL (ref 0.76–1.27)
GFR calc Af Amer: 82 mL/min/{1.73_m2} (ref >59)
GFR calc non Af Amer: 71 mL/min/{1.73_m2} (ref >59)
Glucose: 96 mg/dL (ref 65–99)
Potassium: 4.3 mmol/L (ref 3.5–5.2)
Sodium: 141 mmol/L (ref 134–144)

## 2016-11-07 NOTE — Progress Notes (Signed)
Patient ID: Curtis Warren, male   DOB: [birth year], 61 y.o.   MRN: [id number]      Cardiology Office Note  Date:  11/07/2016   ID:  Curtis Warren, DOB 18-Jan-1956, MRN [id number]  PCP:  Reginia Naas, MD  Cardiologist:   Ena Dawley, MD   Chief complain: Hospital follow up, chest pain   History of Present Illness: Curtis Warren is a 61 y.o. male who presents for posthospitalization follow up.  Curtis Warren is a pleasant obese 61 year old male WITHOUT past medical history of hypertension, hyperlipidemia, DM or any cardiac disease. He is a retired Equities trader. He does occasionally work in the yard and a cough, however does not do any strenuous activity. He has never had any chest pain past. This morning, he was taking the garbage outside, he reached down to grab the garbage bag when he started having sharp substernal chest pain radiating to the back and across the entire chest. He denies any shortness of breath, diaphoresis, or dizziness. He did not try to see if there is anything alleviates his symptom, his wife told him to sit down and called 911 immediately. EMS gave him some aspirin and nitroglycerin which immediately relieved the symptom, his chest pain completely went away after several hours and has not recurred since.  He denies any recent fever, chill, lower extremity edema, orthopnea or paroxysmal nocturnal dyspnea, he does have occasional cough which he attributes to seasonal allergies. Upon arrival in the ED, initial EKG showed no significant ST-T wave changes. Troponin was normal. He was admitted to the internal medicine service and cardiology was consulted. It appears he is estranged daughter who he has not seen for the past 3 years has showed up in the ED to visit the patient, he was very emotional from this, however also stating that the chest pain occurred before he even knew his daughter was coming to visit him.   He ruled out for ACS, he had negative  nuclear stress test for infarct or ischemia, chest CT ruled out aortic dissection and showed ascending aortic aneurysm measuring 4 cm. He was found to be significantly hypertensive and was started on metoprolol. He comes for a follow up, he is feeling better and is asymptomatic.   11/07/2016 - 1 year follow up, the patient is doing well, he denies any recurrent chest pain r SOB. He has been active. Also denies any LE edema, orthopnea or PND. He has great deal of anxiety about his ascending aortic aneurysm and asks if he needs a surgery.   Past Medical History:  Diagnosis Date  . Back pain   . BPH (benign prostatic hyperplasia)     Past Surgical History:  Procedure Laterality Date  . COLONOSCOPY    . HEMORRHOID SURGERY    . POLYPECTOMY       Current Outpatient Prescriptions  Medication Sig Dispense Refill  . aspirin EC 81 MG tablet Take 81 mg by mouth daily.    . cetirizine (ZYRTEC) 10 MG tablet Take 10 mg by mouth daily.    . Coenzyme Q10 (CO Q 10 PO) Take 1 tablet by mouth daily.    . finasteride (PROSCAR) 5 MG tablet Take 5 mg by mouth daily.    Marland Kitchen HYDROcodone-acetaminophen (VICODIN) 5-500 MG per tablet Take 1 tablet by mouth every 6 (six) hours as needed for pain.     . Magnesium 500 MG TABS Take 500 mg by mouth daily.    . Melatonin 1 MG  CAPS Take 1 mg by mouth at bedtime as needed.    . metoprolol tartrate (LOPRESSOR) 25 MG tablet Take 0.5 tablets (12.5 mg total) by mouth 2 (two) times daily. 30 tablet 11  . Multiple Vitamins-Minerals (MENS MULTI VITAMIN & MINERAL PO) Take 1 tablet by mouth daily.    . Multiple Vitamins-Minerals (PRESERVISION AREDS) TABS Take 1 tablet by mouth 2 (two) times daily.    . Tamsulosin HCl (FLOMAX) 0.4 MG CAPS Take 0.4 mg by mouth daily. Reported on 12/08/2015     No current facility-administered medications for this visit.     Allergies:   Patient has no known allergies.    Social History:  The patient  reports that he has never smoked. He has never  used smokeless tobacco. He reports that he does not drink alcohol or use drugs.   Family History:  The patient's family history includes Alcoholism in his mother; Bone cancer in his father; Heart attack (age of onset: 45) in his father.    ROS:  Please see the history of present illness.  All other systems are reviewed and negative.    PHYSICAL EXAM: VS:  BP 128/68   Pulse (!) 58   Ht 5\' 11"  (1.803 m)   Wt 238 lb (108 kg)   SpO2 99%   BMI 33.19 kg/m  , BMI Body mass index is 33.19 kg/m. GEN: Well nourished, well developed, in no acute distress  HEENT: normal  Neck: no JVD, carotid bruits, or masses Cardiac: RRR; no murmurs, rubs, or gallops,no edema  Respiratory:  clear to auscultation bilaterally, normal work of breathing GI: soft, nontender, nondistended, + BS MS: no deformity or atrophy  Skin: warm and dry, no rash Neuro:  Strength and sensation are intact Psych: euthymic mood, full affect  EKG: SR, normal ECG, unchanged from prior.  Recent Labs: 12/08/2015: ALT 51; BUN 15; Creatinine, Ser 1.23; Potassium 4.9; Sodium 139 12/09/2015: Hemoglobin 15.2; Platelets 143   Lipid Panel    Component Value Date/Time   CHOL 137 12/09/2015 0502   TRIG 70 12/09/2015 0502   HDL 30 (L) 12/09/2015 0502   CHOLHDL 4.6 12/09/2015 0502   VLDL 14 12/09/2015 0502   LDLCALC 93 12/09/2015 0502   Wt Readings from Last 3 Encounters:  11/07/16 238 lb (108 kg)  12/19/15 237 lb (107.5 kg)  12/08/15 238 lb (108 kg)    Other studies Reviewed: Stress test, chest CT from the hospital  TTE: 12/09/2015 Left ventricle: The cavity size was normal. Wall thickness was  increased in a pattern of mild LVH. Systolic function was  vigorous. The estimated ejection fraction was in the range of 65%  to 70%. Wall motion was normal; there were no regional wall  motion abnormalities. Doppler parameters are consistent with  abnormal left ventricular relaxation (grade 1 diastolic  dysfunction). - Left  atrium: The atrium was mildly dilated.   ASSESSMENT AND PLAN:  1. Chest pain at rest - negative ischemic work up, normal ECG, no further workup needed  - CT of the chest was negative for aortic dissection  - now asymptomatic, no further workup is needed  2.  Hypertensive heart disease without heart failure - BP improved continue BB  3.  Ascending aortic aneurysm, 4 cm - upper normal for the patient's size, he is explained that absolutely no surgery is needed and he is given exact parameters. We will repeat MRA now.  Follow up in 1 year.  Signed, Ena Dawley, MD  11/07/2016  [facility] [address], [place], [place]  [address] Phone: [phone]; Fax: [phone]

## 2016-11-07 NOTE — Patient Instructions (Signed)
Medication Instructions:   Your physician recommends that you continue on your current medications as directed. Please refer to the Current Medication list given to you today.    Labwork:  TODAY--BMET     Testing/Procedures:  MRA ANGIOGRAM CHEST W/WO CONTRAST FOR ASCENDING AORTIC ANEURYSM      Follow-Up:  Your physician wants you to follow-up in: [place] will receive a reminder letter in the mail two months in advance. If you don't receive a letter, please call our office to schedule the follow-up appointment.       If you need a refill on your cardiac medications before your next appointment, please call your pharmacy.

## 2016-11-15 ENCOUNTER — Ambulatory Visit (HOSPITAL_COMMUNITY)
Admission: RE | Admit: 2016-11-15 | Discharge: 2016-11-15 | Disposition: A | Discharge status: Home / Self Care | Payer: Self-pay | Source: Ambulatory Visit | Attending: Cardiology | Admitting: Cardiology

## 2016-11-15 DIAGNOSIS — I7121 Aneurysm of the ascending aorta, without rupture: Secondary | ICD-10-CM

## 2016-11-15 DIAGNOSIS — I712 Thoracic aortic aneurysm, without rupture: Secondary | ICD-10-CM | POA: Insufficient documentation

## 2016-11-15 MED ORDER — GADOBENATE DIMEGLUMINE 529 MG/ML IV SOLN
20.0000 mL | Freq: Once | INTRAVENOUS | Status: AC
  Administered 2016-11-15: 20 mL via INTRAVENOUS

## 2016-11-18 ENCOUNTER — Telehealth: Payer: Self-pay | Admitting: Cardiology

## 2016-11-18 NOTE — Telephone Encounter (Signed)
I spoke with pt and reviewed  Chest MRA results with him

## 2016-11-18 NOTE — Telephone Encounter (Signed)
New message   Pt is returning call to [facility].

## 2017-01-30 ENCOUNTER — Other Ambulatory Visit: Payer: Self-pay

## 2017-01-30 MED ORDER — METOPROLOL TARTRATE 25 MG PO TABS: 12.5000 mg | ORAL_TABLET | Freq: Two times a day (BID) | ORAL | 11 refills | Status: DC

## 2017-10-24 ENCOUNTER — Encounter: Payer: Self-pay | Admitting: Gastroenterology

## 2017-11-17 ENCOUNTER — Ambulatory Visit (INDEPENDENT_AMBULATORY_CARE_PROVIDER_SITE_OTHER): Payer: Self-pay | Admitting: Physician Assistant

## 2017-11-17 ENCOUNTER — Encounter: Payer: Self-pay | Admitting: Physician Assistant

## 2017-11-17 VITALS — BP 142/90 | HR 63 | Ht 71.0 in | Wt 244.0 lb

## 2017-11-17 DIAGNOSIS — E6609 Other obesity due to excess calories: Secondary | ICD-10-CM

## 2017-11-17 DIAGNOSIS — I10 Essential (primary) hypertension: Secondary | ICD-10-CM

## 2017-11-17 DIAGNOSIS — I5189 Other ill-defined heart diseases: Secondary | ICD-10-CM

## 2017-11-17 DIAGNOSIS — Z6834 Body mass index (BMI) 34.0-34.9, adult: Secondary | ICD-10-CM

## 2017-11-17 DIAGNOSIS — I77810 Thoracic aortic ectasia: Secondary | ICD-10-CM

## 2017-11-17 MED ORDER — CARVEDILOL 6.25 MG PO TABS: 6.2500 mg | ORAL_TABLET | Freq: Two times a day (BID) | ORAL | 3 refills | Status: DC

## 2017-11-17 NOTE — Patient Instructions (Addendum)
Medication Instructions:  1.  STOP the Metoprolol 2.  START Carvedilol 6.25 mg taking 1 tablet twice a day  Labwork: TODAY:  BMET & TSH  Testing/Procedures: None ordered  Follow-Up: Your physician wants you to follow-up in: [place] will receive a reminder letter in the mail two months in advance. If you don't receive a letter, please call our office to schedule the follow-up appointment.  Any Other Special Instructions Will Be Listed Below (If Applicable).  Check your blood pressure at least once a day over the next few days, and call us within several days with those readings. In particular, we are looking to make sure your blood pressure is less than 130 on the top number and 80 on the bottom number.  Your last scan did not show an overt aneurysm, but please review the following precautions: Mainstays of therapy for aneurysms include good blood pressure control, healthy lifestyle, and avoiding fluoroquinolone antibiotic medications (such as those in the "Cipro" class, ending in "floxacin") due to risk of damage to the aorta. Since aneurysms can run in families, you should discuss your diagnosis with any siblings or children to have them screened. Regular mild-moderate physical exercise is OK but avoid heavy lifting/weight lifting over 30lbs, chopping wood, shoveling snow or digging heavy earth with a shovel.    If you need a refill on your cardiac medications before your next appointment, please call your pharmacy.

## 2017-11-17 NOTE — Progress Notes (Signed)
Cardiology Office Note    Date:  11/17/2017  ID:  [name], DOB 19-May-1956, MRN [id number] PCP:  Carol Ada, MD  Cardiologist:  Dr. Meda Warren   Chief Complaint: 1 year follow-up of ascending aortic abnormality  History of Present Illness:  Curtis Warren is a 62 y.o. male with history of back pain, BPH, pre-diabetes, obesity, ascending aortic aneurysm who presents for follow-up. He was remotely evaluated for chest pain, sharp in nature, and ruled out for MI. Nuc 12/2015 was normal, EF 59%. Echo 12/2015 showed mild LVH, EF 65-70%, grade 1 DD, mildly dilated LA. We was noted to have incidental dilation of the thoracic aorta by imaging, last assessed 10/2016 whichw as felt to be top-normal for caliber in male and not overly aneurysmal, max diameter 3.7-3.8. He has had anxiety related to the finding of the thoracic abnormality in the past. Last labs include normal BMET Cr 1.1 (10/2016), Hgb 15.2, plt 143, A1C 5.9, LDL 93 (12/2015).  He presents back for follow-up feeling well. He's not had any recent CP, SOB, LEE, orthopnea, syncope or palpitations. His blood pressure has been running slightly elevated. He's been under stress due to being on a fixed income with no insurance. His wife Curtis Warren is a former neonatal NP who is on disability for depression. He has no family history of aneurysm abnormalities. He's been gaining some weight which he attributes to inactivity. He has tried to remain active with golf.   Past Medical History:  Diagnosis Date  . Back pain   . BPH (benign prostatic hyperplasia)   . 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 Dilatation of thoracic aorta ([place])   . Obesity   . Pre-diabetes     Past Surgical History:  Procedure Laterality Date  . COLONOSCOPY    . HEMORRHOID SURGERY    . POLYPECTOMY      Current Medications: Current Meds  Medication Sig  . aspirin EC 81 MG tablet Take 81 mg by mouth daily.  . cetirizine (ZYRTEC) 10 MG tablet Take 10 mg by  mouth daily.  . Coenzyme Q10 (CO Q 10 PO) Take 1 tablet by mouth daily.  . finasteride (PROSCAR) 5 MG tablet Take 5 mg by mouth daily.  Marland Kitchen HYDROcodone-acetaminophen (VICODIN) 5-500 MG per tablet Take 1 tablet by mouth every 6 (six) hours as needed for pain.   . Magnesium 500 MG TABS Take 500 mg by mouth daily.  . Melatonin 1 MG CAPS Take 1 mg by mouth at bedtime as needed.  . metoprolol tartrate (LOPRESSOR) 25 MG tablet Take 0.5 tablets (12.5 mg total) by mouth 2 (two) times daily.  . Multiple Vitamins-Minerals (MENS MULTI VITAMIN & MINERAL PO) Take 1 tablet by mouth daily.  . Multiple Vitamins-Minerals (PRESERVISION AREDS) TABS Take 1 tablet by mouth 2 (two) times daily.  . Tamsulosin HCl (FLOMAX) 0.4 MG CAPS Take 0.4 mg by mouth daily. Reported on 12/08/2015    Allergies:   Patient has no known allergies.   Social History   Socioeconomic History  . Marital status: Married    Spouse name: None  . Number of children: None  . Years of education: None  . Highest education level: None  Social Needs  . Financial resource strain: None  . Food insecurity - worry: None  . Food insecurity - inability: None  . Transportation needs - medical: None  . Transportation needs - non-medical: None  Occupational History  . None  Tobacco Use  . Smoking  status: Never Smoker  . Smokeless tobacco: Never Used  Substance and Sexual Activity  . Alcohol use: No  . Drug use: No  . Sexual activity: None  Other Topics Concern  . None  Social History Narrative  . None     Family History:  Family History  Problem Relation Age of Onset  . Alcoholism Mother   . Bone cancer Father   . Heart attack Father 83  . Colon cancer Neg Hx     ROS:   Please see the history of present illness.  All other systems are reviewed and otherwise negative.     PHYSICAL EXAM:   VS:  BP (!) 128/92 (BP Location: Left Arm, Patient Position: Sitting, Cuff Size: Large)   Pulse 63   Ht 5\' 11"  (1.803 m)   Wt 244 lb  (110.7 kg)   SpO2 96%   BMI 34.03 kg/m   BMI: Body mass index is 34.03 kg/m. Recheck BP by me 142/90. GEN: Well nourished, well developed obese WM, in no acute distress  HEENT: normocephalic, atraumatic Neck: no JVD, carotid bruits, or masses Cardiac: RRR; no murmurs, rubs, or gallops, no edema  Respiratory:  clear to auscultation bilaterally, normal work of breathing GI: soft, nontender, nondistended, + BS MS: no deformity or atrophy  Skin: warm and dry, no rash Neuro:  Alert and Oriented x 3, Strength and sensation are intact, follows commands Psych: euthymic mood, full affect  Wt Readings from Last 3 Encounters:  11/17/17 244 lb (110.7 kg)  11/07/16 238 lb (108 kg)  12/19/15 237 lb (107.5 kg)      Studies/Labs Reviewed:   EKG:  EKG was ordered today and personally reviewed by me and demonstrates  NSR 63bpm no acute changes.  Recent Labs: No results found for requested labs within last 8760 hours.   Lipid Panel    Component Value Date/Time   CHOL 137 12/09/2015 0502   TRIG 70 12/09/2015 0502   HDL 30 (L) 12/09/2015 0502   CHOLHDL 4.6 12/09/2015 0502   VLDL 14 12/09/2015 0502   LDLCALC 93 12/09/2015 0502    Additional studies/ records that were reviewed today include: Summarized above.    ASSESSMENT & PLAN:   1. Ascending aortic dilation - he is stressed about cost of repeat annual testing which would typically be due this month, although his last imaging only showed top-normal size of ascending aorta. I will reach out to Dr. Meda Warren to find out what she would recommend as far as timing goes. We also reached out to billing to determine if there is a difference between cost of CTA Aorta vs MRA Aorta. Will obtain baseline BMET for when a decision is made. Discussed mainstays of treatment for aortic aneurysms including good BP control, regular low-moderate intensity physical activity, avoiding heavy lifting, screening siblings/children, and avoiding fluoroquinolones. See  below re: BP. 2. Essential HTN - blood pressure is elevated today, confirmed on recheck. May be related to gradual weight gain over time. Will switch metoprolol to carvedilol 6.25mg  BID. Cannot be too aggressive with beta blocker due to baseline HR in the 60s. To save him from having to pay another copay for a blood pressure check, the patient has elected to follow his BP at home with a home cuff. I asked him to please check his BP daily for the next few days and call us later this week with readings so that we can review. Will also update BMET, TSH today. 3. Diastolic dysfunction without  heart failure - appears euvolemic. This suggests to me that he's had intermittently high BP. Dr. Meda Warren previously felt this was related to hypertensive heart disease. 4. Obesity - he understands importance of trying to decrease weight.  Disposition: F/u with Dr. Meda Warren in 1 year.   Medication Adjustments/Labs and Tests Ordered: Current medicines are reviewed at length with the patient today.  Concerns regarding medicines are outlined above. Medication changes, Labs and Tests ordered today are summarized above and listed in the Patient Instructions accessible in Encounters.   Signed, Charlie Pitter, PA-C  11/17/2017 2:40 PM    [facility] [place], [place], [place]  [address] Phone: [phone]; Fax: [phone]

## 2017-11-17 NOTE — Addendum Note (Signed)
Addended by: Gaetano Net on: 11/17/2017 04:06 PM   Modules accepted: Orders

## 2017-11-17 NOTE — Addendum Note (Signed)
Addended by: Benson Setting L on: 11/17/2017 04:02 PM   Modules accepted: Orders

## 2017-11-18 ENCOUNTER — Telehealth: Payer: Self-pay | Admitting: Physician Assistant

## 2017-11-18 LAB — BASIC METABOLIC PANEL
BUN / CREAT RATIO: 13 (ref 10–24)
BUN: 14 mg/dL (ref 8–27)
CHLORIDE: 102 mmol/L (ref 96–106)
CO2: 23 mmol/L (ref 20–29)
CREATININE: 1.04 mg/dL (ref 0.76–1.27)
Calcium: 10 mg/dL (ref 8.6–10.2)
GFR calc non Af Amer: 76 mL/min/{1.73_m2} (ref >59)
GFR, EST AFRICAN AMERICAN: 88 mL/min/{1.73_m2} (ref >59)
Glucose: 82 mg/dL (ref 65–99)
Potassium: 4.3 mmol/L (ref 3.5–5.2)
Sodium: 142 mmol/L (ref 134–144)

## 2017-11-18 LAB — TSH: TSH: 3.15 u[IU]/mL (ref 0.450–4.500)

## 2017-11-18 NOTE — Progress Notes (Signed)
Pt has been made aware of normal result and verbalized understanding.  jw 11/18/17

## 2017-11-18 NOTE — Telephone Encounter (Signed)
Mr Erbe calling to get Test results.  Thanks.

## 2017-11-18 NOTE — Telephone Encounter (Signed)
Returned pts call and discussed his lab results. See result note. 

## 2017-11-18 NOTE — Telephone Encounter (Signed)
-----   Message from Charlie Pitter, [place] sent at 11/18/2017 11:51 AM EDT ----- Please let patient know labs were normal. Melina Copa PA-C

## 2017-11-20 ENCOUNTER — Telehealth: Payer: Self-pay | Admitting: Physician Assistant

## 2017-11-20 NOTE — Telephone Encounter (Signed)
Please call patient. Dr. Meda Coffee reviewed his chart further. Based on the fact that his aorta was stable on recent scans and in fact only at the upper limits of normal by the last scan, she feels he can postpone scan and repeat in 1 year.  Would obtain a CT angio of the aorta or MR MRA w/ w/o contrast of aorta in 11/2018 (whichever is cheaper for the patient) with BMET prior to study, with f/u around that time as well. Dayna Dunn PA-C

## 2017-11-20 NOTE — Telephone Encounter (Signed)
Spoke with pt re: CT/MR MRA and whichever one will be cheaper for him. Pt advises me that he bought some type of policy this year, for heart, but he wasn't sure what was covered.  I advised him to contact his agent and find out and let us know which one he would want [place] to order. Pt stated he may want til closer time for him to f/u in 11/2018.

## 2017-12-02 ENCOUNTER — Telehealth: Payer: Self-pay | Admitting: Cardiology

## 2017-12-02 DIAGNOSIS — I712 Thoracic aortic aneurysm, without rupture: Secondary | ICD-10-CM

## 2017-12-02 DIAGNOSIS — I7121 Aneurysm of the ascending aorta, without rupture: Secondary | ICD-10-CM

## 2017-12-02 DIAGNOSIS — I119 Hypertensive heart disease without heart failure: Secondary | ICD-10-CM

## 2017-12-02 MED ORDER — CARVEDILOL 3.125 MG PO TABS: 3.1250 mg | ORAL_TABLET | Freq: Two times a day (BID) | ORAL | 1 refills | Status: DC

## 2017-12-02 NOTE — Telephone Encounter (Signed)
Pt is calling to give Dayna Dunn PA-C and Dr Meda Coffee an update on his BP at home, since being on carvedilol 6.25 mg.  Pt was ordered by Dayna to take this med bid, but pt is only taking this po daily.  Dayna advised him to call in his BP readings since being on this med, on her OV with the pt from 3/18.  Below is assessment and plan per Dayna, from her OV with the pt on 3/18.   ASSESSMENT & PLAN:   1. Ascending aortic dilation - he is stressed about cost of repeat annual testing which would typically be due this month, although his last imaging only showed top-normal size of ascending aorta. I will reach out to Dr. Meda Coffee to find out what she would recommend as far as timing goes. We also reached out to billing to determine if there is a difference between cost of CTA Aorta vs MRA Aorta. Will obtain baseline BMET for when a decision is made. Discussed mainstays of treatment for aortic aneurysms including good BP control, regular low-moderate intensity physical activity, avoiding heavy lifting, screening siblings/children, and avoiding fluoroquinolones. See below re: BP. 2. Essential HTN - blood pressure is elevated today, confirmed on recheck. May be related to gradual weight gain over time. Will switch metoprolol to carvedilol 6.25mg  BID. Cannot be too aggressive with beta blocker due to baseline HR in the 60s. To save him from having to pay another copay for a blood pressure check, the patient has elected to follow his BP at home with a home cuff. I asked him to please check his BP daily for the next few days and call us later this week with readings so that we can review. Will also update BMET, TSH today. 3. 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 This suggests to me that he's had intermittently high BP. Dr. Meda Coffee previously felt this was related to hypertensive heart disease. 4. Obesity - he understands importance of trying to decrease weight.  Disposition: F/u with Dr.  Meda Coffee in 1 year.   Will forward readings to both Dayna and Dr Meda Coffee for their review and recommendation if needed.

## 2017-12-02 NOTE — Telephone Encounter (Signed)
New message    Pt c/o medication issue:  1. Name of Medication: carvedilol (COREG) 6.25 MG tablet  2. How are you currently taking this medication (dosage and times per day)? Patient states he is only taking once a day for 1 week  3. Are you having a reaction (difficulty breathing--STAT)? No   4. What is your medication issue? BP 117/67 today, patient wants to report he is only taking medication once a day   Pt c/o BP issue: STAT if pt c/o blurred vision, one-sided weakness or slurred speech  1. What are your last 5 BP readings? 116/53, 113/57,124/69, 131/62  2. Are you having any other symptoms (ex. Dizziness, headache, blurred vision, passed out)? NO  3. What is your BP issue? Reporting

## 2017-12-02 NOTE — Telephone Encounter (Signed)
Pts wife also wanted to ask Dayna and Dr Meda Coffee if the pt is due for his yearly image CTA Aorta vs MRA Aorta to be done to follow up on his ascending aortic dilation. Wife states that the pt was hesitant at the last OV with Dayna, about scheduling this due to cost. I called our billing dept myself and got an estimation of cost of the CTA Aorta and the MRA Aorta.  Per billing, CTA Aorta will cost roughly around $1530 and MRA Aorta will be $3700. Did inform the wife that billing dept states that they can make payment arrangements monthly for these test. Wife agreed to having the CTA Aorta done, if Dayna or Dr Meda Coffee felt like this needed to be done in the near future. Informed the pts wife that either myself or 33 CMA will follow-up with imaging recommendations per Dr Meda Coffee or Lisbeth Renshaw.  Wife verbalized understanding and agrees with this plan.

## 2017-12-02 NOTE — Telephone Encounter (Signed)
Dayna do you want an echo now or CTA or MRA? You can send this back to me to place the order and follow-up with the pt.  Thanks.

## 2017-12-02 NOTE — Telephone Encounter (Signed)
Hi Ivy,   Lisbeth Renshaw is out this week and I am covering her inbox. He has not undergone any type of imaging this year so therefore would plan for an echo this year (would be worth checking with billing to make sure this is covered given the significant cost of the CTA and MRA) then CTA Aorta next year as outlined by Dr. Meda Coffee. Can associate the echo with ascending aortic dilation and diastolic dysfunction.   Thanks,  [place]

## 2017-12-02 NOTE — Telephone Encounter (Signed)
I would repeat echo in a year, if ascending aorta > 40 mm than schedule a CTA or MRA

## 2017-12-02 NOTE — Telephone Encounter (Signed)
Please decrease carvedilol to 31.25 mg po bid

## 2017-12-02 NOTE — Telephone Encounter (Signed)
Spoke with the pts wife (on [place]), and informed her that Dr Meda Coffee reviewed the pts logged BP readings, and recommends that we now decrease his carvedilol to 3.125 mg po bid, and continue to monitor his pressures at home.  Confirmed the pharmacy of choice with the pts wife. Wife verbalized understanding and agrees with this plan.  Wife states she will endorse this to the pt when he returns home.

## 2017-12-03 NOTE — Telephone Encounter (Signed)
Spoke with the pt and wife and informed them both that we order for him to have an echo done in the near future, and we will schedule for him to have a CTA Aorta for one year out, to follow up on his ascending aortic aneurysm.  Advised both parties that they should follow-up with their insurance carrier of pricing of the echo.  Informed both parties that I will place the order for both images in the system and send a message to our [facility] schedulers to call them back to arrange for the echo now, and CTA Aorta for one year out.  Both verbalized understanding and agrees with this plan.

## 2017-12-10 NOTE — Telephone Encounter (Signed)
Pts echo is scheduled for 12/12/17 and pts CT Angio Chest Aorta is scheduled for next year on 11/20/18.  Pt made aware of appts made by [facility] scheduling.

## 2017-12-12 ENCOUNTER — Ambulatory Visit (HOSPITAL_COMMUNITY): Payer: Self-pay | Attending: Cardiology

## 2017-12-12 ENCOUNTER — Other Ambulatory Visit: Payer: Self-pay

## 2017-12-12 DIAGNOSIS — I119 Hypertensive heart disease without heart failure: Secondary | ICD-10-CM | POA: Insufficient documentation

## 2017-12-12 DIAGNOSIS — I071 Rheumatic tricuspid insufficiency: Secondary | ICD-10-CM | POA: Insufficient documentation

## 2017-12-12 DIAGNOSIS — Z8249 Family history of ischemic heart disease and other diseases of the circulatory system: Secondary | ICD-10-CM | POA: Insufficient documentation

## 2017-12-12 DIAGNOSIS — I7121 Aneurysm of the ascending aorta, without rupture: Secondary | ICD-10-CM

## 2017-12-12 DIAGNOSIS — I712 Thoracic aortic aneurysm, without rupture: Secondary | ICD-10-CM | POA: Insufficient documentation

## 2017-12-12 DIAGNOSIS — G4733 Obstructive sleep apnea (adult) (pediatric): Secondary | ICD-10-CM | POA: Insufficient documentation

## 2017-12-15 ENCOUNTER — Telehealth: Payer: Self-pay | Admitting: *Deleted

## 2017-12-15 MED ORDER — FUROSEMIDE 20 MG PO TABS: 20.0000 mg | ORAL_TABLET | Freq: Every day | ORAL | 3 refills | Status: DC

## 2017-12-15 NOTE — Telephone Encounter (Signed)
Notified the pt that per Dr Meda Coffee, his echo showed normal LVEF, elevated filling pressures, and she recommends that we start him on lasix 20 mg po daily.  Also informed the pt that his echo showed unchanged size of the ascending aorta measuring 40 mm, and we will continue to follow. Confirmed the pharmacy of choice with the pt. Pt verbalized understanding and agrees with this plan.

## 2017-12-15 NOTE — Telephone Encounter (Signed)
-----   Message from Dorothy Spark, MD sent at 12/15/2017  4:15 PM EDT ----- Normal LVEF, elevated filling pressures, start lasix 20 mg po daily, Unchanged size of the ascending aorta measuring 40 mm, we will follow.

## 2017-12-16 ENCOUNTER — Telehealth: Payer: Self-pay | Admitting: Cardiology

## 2017-12-16 NOTE — Telephone Encounter (Signed)
Result Notes for ECHOCARDIOGRAM COMPLETE   Notes recorded by Nuala Alpha, LPN on 3/83/3383 at 2:91 PM EDT Notified the pt that per Dr Meda Coffee, his echo showed normal LVEF, elevated filling pressures, and she recommends that we start him on lasix 20 mg po daily.  Also informed the pt that his echo showed unchanged size of the ascending aorta measuring 40 mm, and we will continue to follow. Confirmed the pharmacy of choice with the pt. Pt verbalized understanding and agrees with this plan. ------  Notes recorded by Dorothy Spark, MD on 12/15/2017 at 4:15 PM EDT Normal LVEF, elevated filling pressures, start lasix 20 mg po daily, Unchanged size of the ascending aorta measuring 40 mm, we will follow.   Went over the pts echo results and plan per Dr Meda Coffee, with the pts wife (on [place]) over the phone.  Wife verbalized understanding and agrees with this plan.

## 2017-12-16 NOTE — Telephone Encounter (Signed)
New Message   Pt c/o medication issue:  1. Name of Medication: furosemide (LASIX) 20 MG tablet  2. How are you currently taking this medication (dosage and times per day)? Take 1 tablet (20 mg total) by mouth daily  3. Are you having a reaction (difficulty breathing--STAT)? no  4. What is your medication issue? Pt's wife is calling to go over some questions on the echo results and the low dose lasix

## 2018-10-13 ENCOUNTER — Telehealth: Payer: Self-pay | Admitting: Cardiology

## 2018-10-13 MED ORDER — CARVEDILOL 6.25 MG PO TABS: 6.2500 mg | ORAL_TABLET | Freq: Two times a day (BID) | ORAL | 2 refills | Status: DC

## 2018-10-13 NOTE — Telephone Encounter (Signed)
I reviewed messages and OV's, it looks like Metoprolol was stopped and changed to Carvedilol 3.125MG  BID; not sure why Metoprolol was sent in.

## 2018-10-13 NOTE — Telephone Encounter (Signed)
I would continue carvedilol 6.25 mg po BID

## 2018-10-13 NOTE — Telephone Encounter (Signed)
Dr. Meda Coffee, please clarify what beta blocker regimen the pt should be taking.  Per his wife, he is very confused.  His PCP prescribed him metoprolol 25 mg bid, but Dayna Dunn discontinued his metoprolol back on 11/17/17, and switched him to carvedilol 6.25 mg po bid, which is not currently on his med list.  Please advise on what beta blocker regimen and dose the pt should be on. Will follow-up with the pt accordingly thereafter.

## 2018-10-13 NOTE — Telephone Encounter (Signed)
Spoke with the pt and informed him that per Dr Meda Coffee, he does need to stop taking metoprolol that was ordered by his PCP, and he should start taking carvedilol 6.25 mg po bid.  Confirmed the pharmacy of choice with the pt.  Informed the pt that we will see him as planned in April.  Pt verbalized understanding and agrees with this plan.

## 2018-10-13 NOTE — Telephone Encounter (Signed)
Follow up   PT is returning call

## 2018-10-13 NOTE — Telephone Encounter (Signed)
New Message   Pt c/o medication issue:  1. Name of Medication: Metoprolol   2. How are you currently taking this medication (dosage and times per day)?  25mg  1/2 tablet 2xdaily   3. Are you having a reaction (difficulty breathing--STAT)? No  4. What is your medication issue? PT went to pharmacy and they only gave him  15 days worth of medication but he needs enough to get him through until next appt

## 2018-10-23 ENCOUNTER — Telehealth: Payer: Self-pay | Admitting: Cardiology

## 2018-10-23 DIAGNOSIS — I712 Thoracic aortic aneurysm, without rupture: Secondary | ICD-10-CM

## 2018-10-23 DIAGNOSIS — I10 Essential (primary) hypertension: Secondary | ICD-10-CM

## 2018-10-23 DIAGNOSIS — I119 Hypertensive heart disease without heart failure: Secondary | ICD-10-CM

## 2018-10-23 DIAGNOSIS — I5189 Other ill-defined heart diseases: Secondary | ICD-10-CM

## 2018-10-23 DIAGNOSIS — I7121 Aneurysm of the ascending aorta, without rupture: Secondary | ICD-10-CM

## 2018-10-23 MED ORDER — LISINOPRIL 5 MG PO TABS: 5.0000 mg | ORAL_TABLET | Freq: Every day | ORAL | 1 refills | Status: DC

## 2018-10-23 NOTE — Telephone Encounter (Signed)
I agree with Lenna Sciara, thank you!

## 2018-10-23 NOTE — Telephone Encounter (Signed)
Spoke back with the pts wife and endorsed recommendations per our Pharmacist Marcelle Overlie.  Endorsed to the pts wife that we can d/c his carvedilol and start him on Lisinopril 5 mg po daily, but he will need to keep a check on his BP and in 2 weeks, mychart them to Dr Meda Coffee and myself, or bring a log in, same day as he will need to come in for a repeat BMET.  Scheduled the pts  BMET for 2 weeks out on 11/06/18.  Confirmed the pharmacy of choice with the pts wife.  Pt education provided on how to correctly take his BP daily and log this information.  Wife verbalized understanding and agrees with this plan.

## 2018-10-23 NOTE — Telephone Encounter (Signed)
Pts wife is calling on his behalf (on [place]), to report that since he started taking prescribed carvedilol 6.25 mg po bid (see phone note 10/13/18), he is now feeling depressed while being on this med, as well as they feel the pts  BP is running on the lower side at 125/58 with HR-funning regularly in the low 90s.  Pts wife  states that the pt has NO history of any psych disorders or depression, so this is a new change for him.  Pts wife states that when he took metoprolol, he had no issues at all, but this did not help his BP and made his HR run on the lower side (see Dayna's note on 11/17/17).  Wife would like for one of our Pharmacist or Dr Meda Coffee to review this message and the issues the pt is experiencing on carvedilol, and further advise.  Informed the pts wife that his BP and HR are acceptable and very much improved for the pt, given his history of HTN and when referring back to Dayna's last note with pt on 11/17/17.  Informed the pts wife that I will route this message to both Dr Meda Coffee and our Pharmacist for further review and recommendation, and follow-up with the pt or wife thereafter. Wife verbalized understanding and agrees with this plan.

## 2018-10-23 NOTE — Telephone Encounter (Signed)
Although rare (1-3%) depression has been reported as a side effect of carvedilol. I do not see a strong indication that patient needs to be on a beta blocker. Would recommend stopping carvedilol. Would recommend starting lisinopril 5mg  daily. Patient should have his blood pressure checked and BMET in clinic in about 2 weeks, or he should call the clinic with home blood pressure readings in about 2 weeks.

## 2018-10-23 NOTE — Telephone Encounter (Signed)
° ° °  Pt c/o medication issue:  1. Name of Medication: Carvedilol  2. How are you currently taking this medication (dosage and times per day)? As written  3. Are you having a reaction (difficulty breathing--STAT)? no  4. What is your medication issue?  Spouse calling to report patient does not think he should continue taking Carvedilol, causing depression, low BP 125/58, and headache

## 2018-11-06 ENCOUNTER — Other Ambulatory Visit: Payer: Self-pay | Admitting: *Deleted

## 2018-11-06 DIAGNOSIS — I10 Essential (primary) hypertension: Secondary | ICD-10-CM

## 2018-11-06 DIAGNOSIS — I7121 Aneurysm of the ascending aorta, without rupture: Secondary | ICD-10-CM

## 2018-11-06 DIAGNOSIS — I712 Thoracic aortic aneurysm, without rupture: Secondary | ICD-10-CM

## 2018-11-06 DIAGNOSIS — I119 Hypertensive heart disease without heart failure: Secondary | ICD-10-CM

## 2018-11-06 DIAGNOSIS — I5189 Other ill-defined heart diseases: Secondary | ICD-10-CM

## 2018-11-06 LAB — BASIC METABOLIC PANEL
BUN/Creatinine Ratio: 13 (ref 10–24)
BUN: 14 mg/dL (ref 8–27)
CO2: 25 mmol/L (ref 20–29)
Calcium: 9.6 mg/dL (ref 8.6–10.2)
Chloride: 102 mmol/L (ref 96–106)
Creatinine, Ser: 1.12 mg/dL (ref 0.76–1.27)
GFR calc Af Amer: 80 mL/min/{1.73_m2} (ref >59)
GFR calc non Af Amer: 69 mL/min/{1.73_m2} (ref >59)
Glucose: 111 mg/dL — ABNORMAL HIGH (ref 65–99)
Potassium: 4.8 mmol/L (ref 3.5–5.2)
Sodium: 140 mmol/L (ref 134–144)

## 2018-11-19 ENCOUNTER — Telehealth: Payer: Self-pay | Admitting: Cardiology

## 2018-11-19 ENCOUNTER — Telehealth: Payer: Self-pay | Admitting: *Deleted

## 2018-11-19 NOTE — Telephone Encounter (Signed)
-----   Message from Dorothy Spark, MD sent at 11/19/2018  1:03 PM EDT ----- Regarding: RE: Advise about  CT Keep for 3/20, thank you ----- Message ----- From: Nuala Alpha, LPN Sent: 0/14/1030  10:37 AM EDT To: Dorothy Spark, MD Subject: FW: Advise about  CT                           Please advise on this, per our CT Dept.  Cancel or keep for 11/20/18.   ----- Message ----- From: Osvaldo Shipper, NT Sent: 11/19/2018  10:31 AM EDT To: Nuala Alpha, LPN Subject: Advise about  CT                               Karlene Einstein pt is scheduled to have CT 3/20 we are asked to cancel all non emergent CTs can I cancel this pt?   Erline Levine

## 2018-11-19 NOTE — Telephone Encounter (Signed)
Per pt's wife call - stated she would like a call back to talk about her concerns about having this test CT and follow up for now.   Please give her a call back.

## 2018-11-19 NOTE — Telephone Encounter (Signed)
RE: Advise about CT  Received: Today  Message Contents  Dorothy Spark, MD  Nuala Alpha, LPN        Keep for 2/39, thank you   Previous Messages    ----- Message -----  From: Nuala Alpha, LPN  Sent: 5/32/0233 10:37 AM EDT  To: Dorothy Spark, MD  Subject: FW: Advise about CT               Please advise on this, per our CT Dept. Cancel or keep for 11/20/18.   ----- Message -----  From: Osvaldo Shipper, NT  Sent: 11/19/2018 10:31 AM EDT  To: Nuala Alpha, LPN  Subject: Advise about CT                 Karlene Einstein pt is scheduled to have CT 3/20 we are asked to cancel all non emergent CTs can I cancel this pt?   Erline Levine

## 2018-11-19 NOTE — Telephone Encounter (Signed)
Spoke with the pt and endorsed to him that per Dr Meda Coffee, she would like for him to come in as planned for his CT on 11/20/18 at 10 am.  Pt verbalized understanding and agrees with this plan. Pt will come in as planned, and COVID-19 pre-screening questions were asked.     COVID-19 Pre-Screening Questions:  . Do you currently have a fever? NO (yes = cancel and refer to pcp for e-visit) . Have you recently travelled on a cruise, internationally, or to [place], [place], [place], [place], [place], or [place], [place] [employer]) ? NO (yes = cancel, stay home, monitor symptoms, and contact pcp or initiate e-visit if symptoms develop) . Have you been in contact with someone that is currently pending confirmation of Covid19 testing or has been confirmed to have the [place] virus?  NO (yes = cancel, stay home, away from tested individual, monitor symptoms, and contact pcp or initiate e-visit if symptoms develop) . Are you currently experiencing fatigue or cough? NO (yes = pt should be prepared to have a mask placed at the time of their visit).

## 2018-11-20 ENCOUNTER — Other Ambulatory Visit: Payer: Self-pay

## 2018-11-20 ENCOUNTER — Ambulatory Visit (INDEPENDENT_AMBULATORY_CARE_PROVIDER_SITE_OTHER)
Admission: RE | Admit: 2018-11-20 | Discharge: 2018-11-20 | Disposition: A | Discharge status: Home / Self Care | Payer: Self-pay | Source: Ambulatory Visit | Attending: Cardiology | Admitting: Cardiology

## 2018-11-20 ENCOUNTER — Telehealth: Payer: Self-pay | Admitting: *Deleted

## 2018-11-20 DIAGNOSIS — I7121 Aneurysm of the ascending aorta, without rupture: Secondary | ICD-10-CM

## 2018-11-20 DIAGNOSIS — I712 Thoracic aortic aneurysm, without rupture: Secondary | ICD-10-CM

## 2018-11-20 DIAGNOSIS — I119 Hypertensive heart disease without heart failure: Secondary | ICD-10-CM

## 2018-11-20 MED ORDER — IOHEXOL 300 MG/ML  SOLN
100.0000 mL | Freq: Once | INTRAMUSCULAR | Status: AC | PRN
  Administered 2018-11-20: 100 mL via INTRAVENOUS

## 2018-11-20 NOTE — Telephone Encounter (Signed)
Pt made aware of CT Angio of Chest Aorta results per Dr Meda Coffee.  Informed the pt that he will need a repeat scan in 2 years with a BMET prior to. Informed the pt that I will place the order in the system and have our CT scheduler arrange this for 2 years out.  Pt verbalized understanding and agrees with this plan.

## 2018-11-20 NOTE — Telephone Encounter (Signed)
-----   Message from Dorothy Spark, MD sent at 11/20/2018  4:49 PM EDT ----- aorta has a maximum transverse diameter of 4.1 cm, essentially stable from prior, I would repeat in 2 years

## 2018-12-03 ENCOUNTER — Other Ambulatory Visit: Payer: Self-pay | Admitting: Cardiology

## 2018-12-03 MED ORDER — FUROSEMIDE 20 MG PO TABS: 20.0000 mg | ORAL_TABLET | Freq: Every day | ORAL | 1 refills | Status: DC

## 2018-12-03 NOTE — Telephone Encounter (Signed)
Pt's medications were sent to pt's pharmacy as requested. Confirmation received.  

## 2018-12-06 ENCOUNTER — Other Ambulatory Visit: Payer: Self-pay | Admitting: Cardiology

## 2018-12-10 ENCOUNTER — Telehealth: Payer: Self-pay | Admitting: *Deleted

## 2018-12-10 NOTE — Telephone Encounter (Signed)
..      TELEPHONE CALL NOTE  This patient has been deemed a candidate for follow-up tele-health visit to limit community exposure during the Covid-19 pandemic. I spoke with the patient via phone to discuss instructions. This has been outlined on the patient's AVS (dotphrase: hcevisitinfo). The patient was advised to review the section on consent for treatment as well. The patient will receive a phone call 2-3 days prior to their E-Visit at which time consent will be verbally confirmed. A Virtual Office Visit appointment type has been scheduled for VIDEO VISIT with Dr. Meda Coffee on 12/21/18 at 10:40 - patient prefers VIDEO  type. Explained and sent consent with simple instructions on how to use [employer] to the pts active mychart account. He verbalized he will read this prior to [place] calling him several days before his appt. I have either confirmed the patient is active in MyChart or offered to send sign-up link to phone/email via Mychart icon beside patient's photo. Yes   Did the patient verbally acknowledge consent to treatment? Yes  Juventino Slovak, CMA 12/10/2018 10:29 AM

## 2018-12-21 ENCOUNTER — Telehealth (INDEPENDENT_AMBULATORY_CARE_PROVIDER_SITE_OTHER): Payer: Self-pay | Admitting: Cardiology

## 2018-12-21 ENCOUNTER — Encounter: Payer: Self-pay | Admitting: Cardiology

## 2018-12-21 ENCOUNTER — Other Ambulatory Visit: Payer: Self-pay

## 2018-12-21 ENCOUNTER — Encounter: Payer: Self-pay | Admitting: *Deleted

## 2018-12-21 DIAGNOSIS — E669 Obesity, unspecified: Secondary | ICD-10-CM

## 2018-12-21 DIAGNOSIS — R072 Precordial pain: Secondary | ICD-10-CM

## 2018-12-21 DIAGNOSIS — I712 Thoracic aortic aneurysm, without rupture: Secondary | ICD-10-CM

## 2018-12-21 DIAGNOSIS — I119 Hypertensive heart disease without heart failure: Secondary | ICD-10-CM

## 2018-12-21 DIAGNOSIS — I10 Essential (primary) hypertension: Secondary | ICD-10-CM

## 2018-12-21 DIAGNOSIS — I7121 Aneurysm of the ascending aorta, without rupture: Secondary | ICD-10-CM

## 2018-12-21 NOTE — Progress Notes (Signed)
Virtual Visit via Video Note   This visit type was conducted due to national recommendations for restrictions regarding the COVID-19 Pandemic (e.g. social distancing) in an effort to limit this patient's exposure and mitigate transmission in our community.  Due to his co-morbid illnesses, this patient is at least at moderate risk for complications without adequate follow up.  This format is felt to be most appropriate for this patient at this time.  All issues noted in this document were discussed and addressed.  A limited physical exam was performed with this format.  Please refer to the patient's chart for his consent to telehealth for [facility].  Video call attempted by failed, converted to phone visit.  Evaluation Performed:  Follow-up visit  Date:  12/21/2018   ID:  Curtis Warren, Curtis Warren 24-Oct-1955, MRN [id number]  Patient Location: Home Provider Location: Home  PCP:  Carol Ada, MD  Cardiologist: Dr Meda Coffee  Chief Complaint:  Eyelid swelling  History of Present Illness:    Curtis Warren is a 63 y.o. male with with history of back pain, BPH, pre-diabetes, obesity, ascending aortic aneurysm who presents for follow-up. He was remotely evaluated for chest pain, sharp in nature, and ruled out for MI. Nuc 12/2015 was normal, EF 59%. Echo 12/2015 showed mild LVH, EF 65-70%, grade 1 DD, mildly dilated LA. We was noted to have incidental dilation of the thoracic aorta by imaging, last assessed 10/2016 whichw as felt to be top-normal for caliber in male and not overly aneurysmal, max diameter 3.7-3.8. He has had anxiety related to the finding of the thoracic abnormality in the past. Last labs include normal BMET Cr 1.1 (10/2016), Hgb 15.2, plt 143, A1C 5.9, LDL 93 (12/2015).  12/21/2018 - 1 year follow up, the patient underwent chest CTA that showed that at the level of the sinotubular junction, the aorta has a maximum transverse diameter of 4.1 cm, essentially stable from prior MR.  He has been doing well and denies any symptoms of SOB, CP, palpitations, dizziness, no lower extremity edema. His BP has been controlled since he was started on lisinopril however his wife noticed that his eyelids hae been swollen.  The patient does not have symptoms concerning for COVID-19 infection (fever, chills, cough, or new shortness of breath).    Past Medical History:  Diagnosis Date  . Back pain   . BPH (benign prostatic hyperplasia)   . 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 Dilatation of thoracic aorta ([place])   . Essential hypertension   . Obesity   . Pre-diabetes    Past Surgical History:  Procedure Laterality Date  . COLONOSCOPY    . HEMORRHOID SURGERY    . POLYPECTOMY       No outpatient medications have been marked as taking for the 12/21/18 encounter (Telemedicine) with Dorothy Spark, MD.     Allergies:   Carvedilol   Social History   Tobacco Use  . Smoking status: Never Smoker  . Smokeless tobacco: Never Used  Substance Use Topics  . Alcohol use: No  . Drug use: No     Family Hx: The patient's family history includes Alcoholism in his mother; Bone cancer in his father; Heart attack (age of onset: 60) in his father. There is no history of Colon cancer.  ROS:   Please see the history of present illness.    All other systems reviewed and are negative.   Prior CV studies:   The following studies were reviewed  today:  Chest CTA: 11/20/2018 1. At the level of the sinotubular junction, the aorta has a maximum transverse diameter of 4.1 cm, essentially stable from prior MR. Maximum transverse diameter of the ascending aorta at the main pulmonary outflow tract level is 3.9 x 3.7 cm. No dissection evident. No pulmonary embolus. Recommend annual imaging followup by CTA or MRA. This recommendation follows 2010 ACCF/AHA/AATS/ACR/ASA/SCA/SCAI/SIR/STS/SVM Guidelines for the Diagnosis and Management of Patients with Thoracic Aortic Disease.  Circulation. 2010; 121: I264-B583. Aortic aneurysm NOS (ICD10-I71.9).  Labs/Other Tests and Data Reviewed:    EKG:  No ECG reviewed.  Recent Labs: 11/06/2018: BUN 14; Creatinine, Ser 1.12; Potassium 4.8; Sodium 140   Recent Lipid Panel Lab Results  Component Value Date/Time   CHOL 137 12/09/2015 05:02 AM   TRIG 70 12/09/2015 05:02 AM   HDL 30 (L) 12/09/2015 05:02 AM   CHOLHDL 4.6 12/09/2015 05:02 AM   LDLCALC 93 12/09/2015 05:02 AM    Wt Readings from Last 3 Encounters:  12/21/18 234 lb (106.1 kg)  11/17/17 244 lb (110.7 kg)  11/07/16 238 lb (108 kg)     Objective:    Vital Signs:  BP 126/65   Pulse (!) 53   Ht 5\' 11"  (1.803 m)   Wt 234 lb (106.1 kg)   BMI 32.64 kg/m    VITAL SIGNS:  reviewed  ASSESSMENT & PLAN:    1. Ascending aortic dilation - stable chest CTA at 41 mm in 11/2018,  discussed mainstays of treatment for aortic aneurysms including good BP control, regular low-moderate intensity physical activity, avoiding heavy lifting, screening siblings/children, and avoiding fluoroquinolones.  1. Essential HTN - controlled with lisinopril, the patient feels well, concern about eyelid swelling, he is advised to hold off for 3-4 days and let us know if the symptoms improve. In that case we will switch to amlodipine 2.5 mg po daily, otherwise restart lisinopril 5 mg po daily. 2. 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  3. Obesity - he understands importance of trying to decrease weight.  COVID-19 Education: The signs and symptoms of COVID-19 were discussed with the patient and how to seek care for testing (follow up with PCP or arrange E-visit).  The importance of social distancing was discussed today.  Time:   Today, I have spent 23 minutes with the patient with telehealth technology discussing the above problems.     Medication Adjustments/Labs and Tests Ordered: Current medicines are reviewed at length with the patient today.  Concerns  regarding medicines are outlined above.   Tests Ordered: No orders of the defined types were placed in this encounter.   Medication Changes: No orders of the defined types were placed in this encounter.   Disposition:  Follow up in 6 month(s)  Signed, Ena Dawley, MD  12/21/2018 10:21 AM    [place]

## 2018-12-21 NOTE — Patient Instructions (Signed)
Medication Instructions:   Your physician recommends that you continue on your current medications as directed. Please refer to the Current Medication list given to you today.  If you need a refill on your cardiac medications before your next appointment, please call your pharmacy.     Follow-Up: At [facility], you and your health needs are our priority.  As part of our continuing mission to provide you with exceptional heart care, we have created designated Provider Care Teams.  These Care Teams include your primary Cardiologist (physician) and Advanced Practice Providers (APPs -  Physician Assistants and Nurse Practitioners) who all work together to provide you with the care you need, when you need it.  Your physician wants you to follow-up in: [place] will receive a reminder letter in the mail two months in advance. If you don't receive a letter, please call our office to schedule the follow-up appointment.

## 2018-12-24 ENCOUNTER — Ambulatory Visit: Payer: Self-pay | Admitting: Cardiology

## 2019-05-14 ENCOUNTER — Encounter: Payer: Self-pay | Admitting: Cardiology

## 2019-05-14 ENCOUNTER — Ambulatory Visit (INDEPENDENT_AMBULATORY_CARE_PROVIDER_SITE_OTHER): Payer: Self-pay | Admitting: Cardiology

## 2019-05-14 ENCOUNTER — Other Ambulatory Visit: Payer: Self-pay

## 2019-05-14 VITALS — BP 130/76 | HR 83 | Ht 71.0 in | Wt 237.6 lb

## 2019-05-14 DIAGNOSIS — I712 Thoracic aortic aneurysm, without rupture: Secondary | ICD-10-CM

## 2019-05-14 DIAGNOSIS — I7121 Aneurysm of the ascending aorta, without rupture: Secondary | ICD-10-CM

## 2019-05-14 DIAGNOSIS — I119 Hypertensive heart disease without heart failure: Secondary | ICD-10-CM

## 2019-05-14 NOTE — Patient Instructions (Addendum)
Your physician recommends that you continue on your current medications as directed. Please refer to the Current Medication list given to you today.  Your physician recommends that you return for lab work in: FASTING CBC CMP LIPID AND TSH PRIOR TO 6 MONTH APPT  Your physician wants you to follow-up in:  [place] will receive a reminder letter in the mail two months in advance. If you don't receive a letter, please call our office to schedule the follow-up appointment.

## 2019-05-14 NOTE — Progress Notes (Signed)
Cardiology Office Note:    Date:  05/15/2019   ID:  Curtis Warren, DOB [birth year], MRN [id number]  PCP:  Carol Ada, MD  Cardiologist:  No primary care provider on file.  Electrophysiologist:  None   Referring MD: Carol Ada, MD   Reason for visit: 5 months follow-up  History of Present Illness:    Curtis Warren is a 63 y.o. male with a hx of  pre-diabetes, obesity, borderline ascending aortic aneurysm who presents for follow-up. He was remotely evaluated for chest pain, sharp in nature, and ruled out for MI. Nuc 12/2015 was normal, EF 59%. Echo 12/2015 showed mild LVH, EF 65-70%, grade 1 DD, mildly dilated LA. We was noted to have incidental dilation of the thoracic aorta by imaging, last assessed 10/2016 whichw as felt to be top-normal for caliber in male and not overly aneurysmal, max diameter 3.7-3.8. He has had anxiety related to the finding of the thoracic abnormality in the past. Last labs include normal BMET Cr 1.1 (10/2016), Hgb 15.2, plt 143, A1C 5.9, LDL 93 (12/2015).  12/21/2018 - 1 year follow up, the patient underwent chest CTA that showed that at the level of the sinotubular junction, the aorta has a maximum transverse diameter of 4.1 cm, essentially stable from prior MR. He has been doing well and denies any symptoms of SOB, CP, palpitations, dizziness, no lower extremity edema. His BP has been controlled since he was started on lisinopril however his wife noticed that his eyelids have been swollen.  05/14/2019 -this is 5 months follow-up, the patient states he is doing really well, he has no chest pain shortness of breath, he continues to play golf several times a week without any symptoms.  He has been compliant with his medications and has no side effects.  He feels that his eyelid swelling has improved.  Past Medical History:  Diagnosis Date  . Back pain   . BPH (benign prostatic hyperplasia)   . 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 Dilatation of  thoracic aorta ([place])   . Essential hypertension   . Obesity   . Pre-diabetes     Past Surgical History:  Procedure Laterality Date  . COLONOSCOPY    . HEMORRHOID SURGERY    . POLYPECTOMY      Current Medications: Current Meds  Medication Sig  . aspirin EC 81 MG tablet Take 81 mg by mouth daily.  . cetirizine (ZYRTEC) 10 MG tablet Take 10 mg by mouth as needed.   . Coenzyme Q10 (CO Q 10 PO) Take 1 tablet by mouth daily.  . finasteride (PROSCAR) 5 MG tablet Take 5 mg by mouth daily.  . furosemide (LASIX) 20 MG tablet Take 1 tablet (20 mg total) by mouth daily. Please make overdue appt with Dr. Meda Coffee before anymore refills. 3rd attempt  . HYDROcodone-acetaminophen (VICODIN) 5-500 MG per tablet Take 1 tablet by mouth every 6 (six) hours as needed for pain.   Marland Kitchen lisinopril (PRINIVIL,ZESTRIL) 5 MG tablet Take 1 tablet (5 mg total) by mouth daily.  . Magnesium 500 MG TABS Take 500 mg by mouth daily.  . Multiple Vitamins-Minerals (MENS MULTI VITAMIN & MINERAL PO) Take 1 tablet by mouth daily.  . Multiple Vitamins-Minerals (PRESERVISION AREDS) TABS Take 1 tablet by mouth 2 (two) times daily.  . Tamsulosin HCl (FLOMAX) 0.4 MG CAPS Take 0.4 mg by mouth daily. Reported on 12/08/2015     Allergies:   Carvedilol   Social History   Socioeconomic History  .  Marital status: Married    Spouse name: Not on file  . Number of children: Not on file  . Years of education: Not on file  . Highest education level: Not on file  Occupational History  . Not on file  Social Needs  . Financial resource strain: Not on file  . Food insecurity    Worry: Not on file    Inability: Not on file  . Transportation needs    Medical: Not on file    Non-medical: Not on file  Tobacco Use  . Smoking status: Never Smoker  . Smokeless tobacco: Never Used  Substance and Sexual Activity  . Alcohol use: No  . Drug use: No  . Sexual activity: Not on file  Lifestyle  . Physical activity    Days per week: Not on  file    Minutes per session: Not on file  . Stress: Not on file  Relationships  . Social Herbalist on phone: Not on file    Gets together: Not on file    Attends religious service: Not on file    Active member of club or organization: Not on file    Attends meetings of clubs or organizations: Not on file    Relationship status: Not on file  Other Topics Concern  . Not on file  Social History Narrative  . Not on file     Family History: The patient's family history includes Alcoholism in his mother; Bone cancer in his father; Heart attack (age of onset: 16) in his father. There is no history of Colon cancer.  ROS:   Please see the history of present illness.    All other systems reviewed and are negative.  EKGs/Labs/Other Studies Reviewed:    The following studies were reviewed today: EKG:  EKG is ordered today.  The ekg ordered today demonstrates normal sinus rhythm, normal EKG, unchanged from prior, personally reviewed.  Recent Labs: 11/06/2018: BUN 14; Creatinine, Ser 1.12; Potassium 4.8; Sodium 140  Recent Lipid Panel    Component Value Date/Time   CHOL 137 12/09/2015 0502   TRIG 70 12/09/2015 0502   HDL 30 (L) 12/09/2015 0502   CHOLHDL 4.6 12/09/2015 0502   VLDL 14 12/09/2015 0502   LDLCALC 93 12/09/2015 0502    Physical Exam:    VS:  BP 130/76   Pulse 83   Ht '5\' 11"'  (1.803 m)   Wt 237 lb 9.6 oz (107.8 kg)   SpO2 95%   BMI 33.14 kg/m     Wt Readings from Last 3 Encounters:  05/14/19 237 lb 9.6 oz (107.8 kg)  12/21/18 234 lb (106.1 kg)  11/17/17 244 lb (110.7 kg)     GEN: Well nourished, well developed in no acute distress HEENT: Normal NECK: No JVD; No carotid bruits LYMPHATICS: No lymphadenopathy CARDIAC: RRR, no murmurs, rubs, gallops RESPIRATORY:  Clear to auscultation without rales, wheezing or rhonchi  ABDOMEN: Soft, non-tender, non-distended MUSCULOSKELETAL:  No edema; No deformity  SKIN: Warm and dry NEUROLOGIC:  Alert and  oriented x 3 PSYCHIATRIC:  Normal affect   ASSESSMENT:    1. Ascending aortic aneurysm ([place])   2. Hypertensive heart disease without heart failure    PLAN:    In order of problems listed above:  1. Ascending aortic dilation - stable chest CTA at 41 mm in 11/2018 from prior scan in 2016, he is explaining length that considering his age height and male sex this is actually upper normal,  will repeat again in 2 years. Discussed mainstays of treatment for aortic aneurysms including good BP control, regular low-moderate intensity physical activity, avoiding heavy lifting, screening siblings/children, and avoiding fluoroquinolones.  2. Essential HTN -his eyelid swelling has improved will continue to same management. 3. 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  4. Obesity - exercising 3x/week.   Medication Adjustments/Labs and Tests Ordered: Current medicines are reviewed at length with the patient today.  Concerns regarding medicines are outlined above.  Orders Placed This Encounter  Procedures  . CBC  . TSH  . Lipid panel  . Comp Met (CMET)  . EKG 12-Lead   No orders of the defined types were placed in this encounter.   Patient Instructions  Your physician recommends that you continue on your current medications as directed. Please refer to the Current Medication list given to you today.  Your physician recommends that you return for lab work in: FASTING CBC CMP LIPID AND TSH PRIOR TO 6 MONTH APPT  Your physician wants you to follow-up in:  [place] will receive a reminder letter in the mail two months in advance. If you don't receive a letter, please call our office to schedule the follow-up appointment.     Signed, Ena Dawley, MD  05/15/2019 8:11 PM    [place]

## 2019-06-18 ENCOUNTER — Other Ambulatory Visit: Payer: Self-pay | Admitting: Cardiology

## 2019-06-29 ENCOUNTER — Other Ambulatory Visit: Payer: Self-pay | Admitting: Cardiology

## 2019-07-03 ENCOUNTER — Other Ambulatory Visit: Payer: Self-pay | Admitting: Cardiology

## 2019-10-06 DIAGNOSIS — E7800 Pure hypercholesterolemia, unspecified: Secondary | ICD-10-CM | POA: Diagnosis not present

## 2019-10-06 DIAGNOSIS — G4733 Obstructive sleep apnea (adult) (pediatric): Secondary | ICD-10-CM | POA: Diagnosis not present

## 2019-10-06 DIAGNOSIS — N400 Enlarged prostate without lower urinary tract symptoms: Secondary | ICD-10-CM | POA: Diagnosis not present

## 2019-10-06 DIAGNOSIS — I10 Essential (primary) hypertension: Secondary | ICD-10-CM | POA: Diagnosis not present

## 2019-10-06 DIAGNOSIS — M549 Dorsalgia, unspecified: Secondary | ICD-10-CM | POA: Diagnosis not present

## 2019-10-06 DIAGNOSIS — Z23 Encounter for immunization: Secondary | ICD-10-CM | POA: Diagnosis not present

## 2019-10-06 DIAGNOSIS — R7303 Prediabetes: Secondary | ICD-10-CM | POA: Diagnosis not present

## 2019-11-04 DIAGNOSIS — M9903 Segmental and somatic dysfunction of lumbar region: Secondary | ICD-10-CM | POA: Diagnosis not present

## 2019-11-04 DIAGNOSIS — M545 Low back pain: Secondary | ICD-10-CM | POA: Diagnosis not present

## 2019-11-04 DIAGNOSIS — M5136 Other intervertebral disc degeneration, lumbar region: Secondary | ICD-10-CM | POA: Diagnosis not present

## 2019-11-08 ENCOUNTER — Other Ambulatory Visit: Payer: Self-pay

## 2019-11-08 ENCOUNTER — Other Ambulatory Visit: Payer: Medicare HMO | Admitting: *Deleted

## 2019-11-08 DIAGNOSIS — I712 Thoracic aortic aneurysm, without rupture: Secondary | ICD-10-CM | POA: Diagnosis not present

## 2019-11-08 DIAGNOSIS — M9903 Segmental and somatic dysfunction of lumbar region: Secondary | ICD-10-CM | POA: Diagnosis not present

## 2019-11-08 DIAGNOSIS — I119 Hypertensive heart disease without heart failure: Secondary | ICD-10-CM | POA: Diagnosis not present

## 2019-11-08 DIAGNOSIS — R079 Chest pain, unspecified: Secondary | ICD-10-CM | POA: Diagnosis not present

## 2019-11-08 DIAGNOSIS — M5136 Other intervertebral disc degeneration, lumbar region: Secondary | ICD-10-CM | POA: Diagnosis not present

## 2019-11-08 DIAGNOSIS — I5189 Other ill-defined heart diseases: Secondary | ICD-10-CM | POA: Diagnosis not present

## 2019-11-08 DIAGNOSIS — M545 Low back pain: Secondary | ICD-10-CM | POA: Diagnosis not present

## 2019-11-08 LAB — CBC
Hematocrit: 44.6 % (ref 37.5–51.0)
Hemoglobin: 15.1 g/dL (ref 13.0–17.7)
MCH: 30.8 pg (ref 26.6–33.0)
MCHC: 33.9 g/dL (ref 31.5–35.7)
MCV: 91 fL (ref 79–97)
Platelets: 166 10*3/uL (ref 150–450)
RBC: 4.9 x10E6/uL (ref 4.14–5.80)
RDW: 12 % (ref 11.6–15.4)
WBC: 6.6 10*3/uL (ref 3.4–10.8)

## 2019-11-08 LAB — COMPREHENSIVE METABOLIC PANEL
ALT: 36 IU/L (ref 0–44)
AST: 23 IU/L (ref 0–40)
Albumin/Globulin Ratio: 1.8 (ref 1.2–2.2)
Albumin: 4.2 g/dL (ref 3.8–4.8)
Alkaline Phosphatase: 123 IU/L — ABNORMAL HIGH (ref 39–117)
BUN/Creatinine Ratio: 14 (ref 10–24)
BUN: 17 mg/dL (ref 8–27)
Bilirubin Total: 0.6 mg/dL (ref 0.0–1.2)
CO2: 23 mmol/L (ref 20–29)
Calcium: 9.2 mg/dL (ref 8.6–10.2)
Chloride: 104 mmol/L (ref 96–106)
Creatinine, Ser: 1.21 mg/dL (ref 0.76–1.27)
GFR calc Af Amer: 72 mL/min/{1.73_m2} (ref >59)
GFR calc non Af Amer: 62 mL/min/{1.73_m2} (ref >59)
Globulin, Total: 2.4 g/dL (ref 1.5–4.5)
Glucose: 110 mg/dL — ABNORMAL HIGH (ref 65–99)
Potassium: 4.1 mmol/L (ref 3.5–5.2)
Sodium: 141 mmol/L (ref 134–144)
Total Protein: 6.6 g/dL (ref 6.0–8.5)

## 2019-11-08 LAB — LIPID PANEL
Chol/HDL Ratio: 4.4 ratio (ref 0.0–5.0)
Cholesterol, Total: 157 mg/dL (ref 100–199)
HDL: 36 mg/dL — ABNORMAL LOW (ref >39)
LDL Chol Calc (NIH): 99 mg/dL (ref 0–99)
Triglycerides: 119 mg/dL (ref 0–149)
VLDL Cholesterol Cal: 22 mg/dL (ref 5–40)

## 2019-11-08 LAB — TSH: TSH: 3.95 u[IU]/mL (ref 0.450–4.500)

## 2019-11-09 ENCOUNTER — Telehealth: Payer: Self-pay | Admitting: Cardiology

## 2019-11-09 DIAGNOSIS — M545 Low back pain: Secondary | ICD-10-CM | POA: Diagnosis not present

## 2019-11-09 DIAGNOSIS — M5136 Other intervertebral disc degeneration, lumbar region: Secondary | ICD-10-CM | POA: Diagnosis not present

## 2019-11-09 DIAGNOSIS — M9903 Segmental and somatic dysfunction of lumbar region: Secondary | ICD-10-CM | POA: Diagnosis not present

## 2019-11-09 NOTE — Progress Notes (Signed)
Pt has been notified of lab results with verbal understanding.

## 2019-11-09 NOTE — Telephone Encounter (Signed)
I s/w the pt and he has been made aware of lab results by phone with verbal understanding. Pt thanked me for the call.

## 2019-11-09 NOTE — Telephone Encounter (Signed)
Patient returning Carol's call in regards to lab results.

## 2019-11-09 NOTE — Progress Notes (Signed)
error 

## 2019-11-10 DIAGNOSIS — G4733 Obstructive sleep apnea (adult) (pediatric): Secondary | ICD-10-CM | POA: Diagnosis not present

## 2019-11-10 DIAGNOSIS — R69 Illness, unspecified: Secondary | ICD-10-CM | POA: Diagnosis not present

## 2019-11-11 DIAGNOSIS — M545 Low back pain: Secondary | ICD-10-CM | POA: Diagnosis not present

## 2019-11-11 DIAGNOSIS — M9903 Segmental and somatic dysfunction of lumbar region: Secondary | ICD-10-CM | POA: Diagnosis not present

## 2019-11-11 DIAGNOSIS — M5136 Other intervertebral disc degeneration, lumbar region: Secondary | ICD-10-CM | POA: Diagnosis not present

## 2019-11-15 DIAGNOSIS — I7121 Aneurysm of the ascending aorta, without rupture: Secondary | ICD-10-CM

## 2019-11-15 DIAGNOSIS — I119 Hypertensive heart disease without heart failure: Secondary | ICD-10-CM

## 2019-11-15 DIAGNOSIS — I712 Thoracic aortic aneurysm, without rupture: Secondary | ICD-10-CM

## 2019-11-15 DIAGNOSIS — I77810 Thoracic aortic ectasia: Secondary | ICD-10-CM

## 2019-11-15 DIAGNOSIS — M5136 Other intervertebral disc degeneration, lumbar region: Secondary | ICD-10-CM | POA: Diagnosis not present

## 2019-11-15 DIAGNOSIS — M9903 Segmental and somatic dysfunction of lumbar region: Secondary | ICD-10-CM | POA: Diagnosis not present

## 2019-11-15 DIAGNOSIS — I5189 Other ill-defined heart diseases: Secondary | ICD-10-CM

## 2019-11-15 DIAGNOSIS — M545 Low back pain: Secondary | ICD-10-CM | POA: Diagnosis not present

## 2019-11-15 NOTE — Telephone Encounter (Signed)
-----   Message from Armando Gang sent at 11/15/2019  3:25 PM EDT ----- Regarding: FW: Pt due for CT Angio of Aorta Schedule for 11-20-20 @ 11:00/  will also need bmp order/ schedule him for lab on 11-10-20 ----- Message ----- From: Nuala Alpha, LPN Sent: [id number]  10:04 AM EDT To: Rudi Heap, # Subject: Pt due for CT Angio of Aorta                   This pt is due now for his CT ANGIO AORTA now.  Order was placed awhile back.  He sent a mychart message asking about getting this scheduled. Can you please call and arrange?    Thanks for all you do, Karlene Einstein

## 2019-11-15 NOTE — Telephone Encounter (Signed)
FW: Pt due for CT Angio of Aorta Received: Today Message Contents  Jerlyn Ly, LPN  Schedule for [id number] @ 11:00/ will also need bmp order/ schedule him for lab on 11-10-20    Will go ahead and place BMET order, for pt to have done prior to his CT ANGIO AORTA CHEST, per CT protocol.

## 2019-11-16 DIAGNOSIS — M9903 Segmental and somatic dysfunction of lumbar region: Secondary | ICD-10-CM | POA: Diagnosis not present

## 2019-11-16 DIAGNOSIS — M5136 Other intervertebral disc degeneration, lumbar region: Secondary | ICD-10-CM | POA: Diagnosis not present

## 2019-11-16 DIAGNOSIS — M545 Low back pain: Secondary | ICD-10-CM | POA: Diagnosis not present

## 2019-11-18 DIAGNOSIS — M9903 Segmental and somatic dysfunction of lumbar region: Secondary | ICD-10-CM | POA: Diagnosis not present

## 2019-11-18 DIAGNOSIS — M5136 Other intervertebral disc degeneration, lumbar region: Secondary | ICD-10-CM | POA: Diagnosis not present

## 2019-11-18 DIAGNOSIS — M545 Low back pain: Secondary | ICD-10-CM | POA: Diagnosis not present

## 2019-11-19 DIAGNOSIS — G4733 Obstructive sleep apnea (adult) (pediatric): Secondary | ICD-10-CM | POA: Diagnosis not present

## 2019-11-22 DIAGNOSIS — M9903 Segmental and somatic dysfunction of lumbar region: Secondary | ICD-10-CM | POA: Diagnosis not present

## 2019-11-22 DIAGNOSIS — M545 Low back pain: Secondary | ICD-10-CM | POA: Diagnosis not present

## 2019-11-22 DIAGNOSIS — M5136 Other intervertebral disc degeneration, lumbar region: Secondary | ICD-10-CM | POA: Diagnosis not present

## 2019-11-23 DIAGNOSIS — M9903 Segmental and somatic dysfunction of lumbar region: Secondary | ICD-10-CM | POA: Diagnosis not present

## 2019-11-23 DIAGNOSIS — M5136 Other intervertebral disc degeneration, lumbar region: Secondary | ICD-10-CM | POA: Diagnosis not present

## 2019-11-23 DIAGNOSIS — M545 Low back pain: Secondary | ICD-10-CM | POA: Diagnosis not present

## 2019-11-25 DIAGNOSIS — M545 Low back pain: Secondary | ICD-10-CM | POA: Diagnosis not present

## 2019-11-25 DIAGNOSIS — M9903 Segmental and somatic dysfunction of lumbar region: Secondary | ICD-10-CM | POA: Diagnosis not present

## 2019-11-25 DIAGNOSIS — M5136 Other intervertebral disc degeneration, lumbar region: Secondary | ICD-10-CM | POA: Diagnosis not present

## 2019-11-29 DIAGNOSIS — M545 Low back pain: Secondary | ICD-10-CM | POA: Diagnosis not present

## 2019-11-29 DIAGNOSIS — M9903 Segmental and somatic dysfunction of lumbar region: Secondary | ICD-10-CM | POA: Diagnosis not present

## 2019-11-29 DIAGNOSIS — M5136 Other intervertebral disc degeneration, lumbar region: Secondary | ICD-10-CM | POA: Diagnosis not present

## 2019-12-01 DIAGNOSIS — M9903 Segmental and somatic dysfunction of lumbar region: Secondary | ICD-10-CM | POA: Diagnosis not present

## 2019-12-01 DIAGNOSIS — M5136 Other intervertebral disc degeneration, lumbar region: Secondary | ICD-10-CM | POA: Diagnosis not present

## 2019-12-01 DIAGNOSIS — M545 Low back pain: Secondary | ICD-10-CM | POA: Diagnosis not present

## 2019-12-07 DIAGNOSIS — M545 Low back pain: Secondary | ICD-10-CM | POA: Diagnosis not present

## 2019-12-07 DIAGNOSIS — M9903 Segmental and somatic dysfunction of lumbar region: Secondary | ICD-10-CM | POA: Diagnosis not present

## 2019-12-07 DIAGNOSIS — M5136 Other intervertebral disc degeneration, lumbar region: Secondary | ICD-10-CM | POA: Diagnosis not present

## 2019-12-08 DIAGNOSIS — M9903 Segmental and somatic dysfunction of lumbar region: Secondary | ICD-10-CM | POA: Diagnosis not present

## 2019-12-08 DIAGNOSIS — M5136 Other intervertebral disc degeneration, lumbar region: Secondary | ICD-10-CM | POA: Diagnosis not present

## 2019-12-08 DIAGNOSIS — M545 Low back pain: Secondary | ICD-10-CM | POA: Diagnosis not present

## 2019-12-15 DIAGNOSIS — M5136 Other intervertebral disc degeneration, lumbar region: Secondary | ICD-10-CM | POA: Diagnosis not present

## 2019-12-15 DIAGNOSIS — M545 Low back pain: Secondary | ICD-10-CM | POA: Diagnosis not present

## 2019-12-15 DIAGNOSIS — M9903 Segmental and somatic dysfunction of lumbar region: Secondary | ICD-10-CM | POA: Diagnosis not present

## 2019-12-20 DIAGNOSIS — G4733 Obstructive sleep apnea (adult) (pediatric): Secondary | ICD-10-CM | POA: Diagnosis not present

## 2019-12-22 DIAGNOSIS — M9903 Segmental and somatic dysfunction of lumbar region: Secondary | ICD-10-CM | POA: Diagnosis not present

## 2019-12-22 DIAGNOSIS — M5136 Other intervertebral disc degeneration, lumbar region: Secondary | ICD-10-CM | POA: Diagnosis not present

## 2019-12-22 DIAGNOSIS — M545 Low back pain: Secondary | ICD-10-CM | POA: Diagnosis not present

## 2020-01-06 ENCOUNTER — Ambulatory Visit: Payer: Medicare HMO | Admitting: Cardiology

## 2020-01-06 ENCOUNTER — Encounter: Payer: Self-pay | Admitting: Cardiology

## 2020-01-06 ENCOUNTER — Other Ambulatory Visit: Payer: Self-pay

## 2020-01-06 VITALS — BP 126/76 | HR 72 | Ht 71.0 in | Wt 238.2 lb

## 2020-01-06 DIAGNOSIS — I5189 Other ill-defined heart diseases: Secondary | ICD-10-CM

## 2020-01-06 DIAGNOSIS — I119 Hypertensive heart disease without heart failure: Secondary | ICD-10-CM

## 2020-01-06 DIAGNOSIS — I77810 Thoracic aortic ectasia: Secondary | ICD-10-CM | POA: Diagnosis not present

## 2020-01-06 NOTE — Patient Instructions (Signed)
Medication Instructions:  ° °Your physician recommends that you continue on your current medications as directed. Please refer to the Current Medication list given to you today. ° °*If you need a refill on your cardiac medications before your next appointment, please call your pharmacy* ° °Follow-Up: °At [facility], you and your health needs are our priority.  As part of our continuing mission to provide you with exceptional heart care, we have created designated Provider Care Teams.  These Care Teams include your primary Cardiologist (physician) and Advanced Practice Providers (APPs -  Physician Assistants and Nurse Practitioners) who all work together to provide you with the care you need, when you need it. ° °We recommend signing up for the patient portal called "MyChart".  Sign up information is provided on this After Visit Summary.  MyChart is used to connect with patients for Virtual Visits (Telemedicine).  Patients are able to view lab/test results, encounter notes, upcoming appointments, etc.  Non-urgent messages can be sent to your provider as well.   °To learn more about what you can do with MyChart, go to [email or web address].   ° °Your next appointment:   °12 month(s) ° °The format for your next appointment:   °In Person ° °Provider:   °Katarina Nelson, MD ° ° ° ° °

## 2020-01-06 NOTE — Progress Notes (Signed)
Cardiology Office Note:    Date:  01/06/2020   ID:  Curtis Warren, DOB 1955/10/27, MRN [id number]  PCP:  Carol Ada, MD  Cardiologist:  No primary care provider on file.  Electrophysiologist:  None   Referring MD: Carol Ada, MD   Reason for visit: 6 months follow-up  History of Present Illness:    Curtis Warren is a 64 y.o. male with a hx of  pre-diabetes, obesity, borderline ascending aortic aneurysm who presents for follow-up. He was remotely evaluated for chest pain, sharp in nature, and ruled out for MI. Nuc 12/2015 was normal, EF 59%. Echo 12/2015 showed mild LVH, EF 65-70%, grade 1 DD, mildly dilated LA. We was noted to have incidental dilation of the thoracic aorta by imaging, last assessed 10/2016 whichw as felt to be top-normal for caliber in male and not overly aneurysmal, max diameter 3.7-3.8. He has had anxiety related to the finding of the thoracic abnormality in the past. Last labs include normal BMET Cr 1.1 (10/2016), Hgb 15.2, plt 143, A1C 5.9, LDL 93 (12/2015).  12/21/2018 - 1 year follow up, the patient underwent chest CTA that showed that at the level of the sinotubular junction, the aorta has a maximum transverse diameter of 4.1 cm, essentially stable from prior MR. He has been doing well and denies any symptoms of SOB, CP, palpitations, dizziness, no lower extremity edema. His BP has been controlled since he was started on lisinopril however his wife noticed that his eyelids have been swollen.  05/14/2019 -this is 5 months follow-up, the patient states he is doing really well, he has no chest pain shortness of breath, he continues to play golf several times a week without any symptoms.  He has been compliant with his medications and has no side effects.  He feels that his eyelid swelling has improved.  01/06/2020 -the patient is coming after 6 months he has been doing great, he is fully active and asymptomatic and denies any chest pain or shortness of breath.  He  has no lower extremity edema he has some feet numbness predominantly at night.  No orthostatic hypotension no falls.  He tolerates his medications well.  Past Medical History:  Diagnosis Date  . Back pain   . BPH (benign prostatic hyperplasia)   . 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 Dilatation of thoracic aorta ([place])   . Essential hypertension   . Obesity   . Pre-diabetes     Past Surgical History:  Procedure Laterality Date  . COLONOSCOPY    . HEMORRHOID SURGERY    . POLYPECTOMY      Current Medications: Current Meds  Medication Sig  . aspirin EC 81 MG tablet Take 81 mg by mouth daily.  . cetirizine (ZYRTEC) 10 MG tablet Take 10 mg by mouth as needed.   . Coenzyme Q10 (CO Q 10 PO) Take 1 tablet by mouth daily.  . finasteride (PROSCAR) 5 MG tablet Take 5 mg by mouth daily.  . furosemide (LASIX) 20 MG tablet TAKE 1 TABLET EVERY DAY  . HYDROcodone-acetaminophen (VICODIN) 5-500 MG per tablet Take 1 tablet by mouth every 6 (six) hours as needed for pain.   Marland Kitchen lisinopril (PRINIVIL,ZESTRIL) 5 MG tablet Take 1 tablet (5 mg total) by mouth daily.  . Magnesium 500 MG TABS Take 500 mg by mouth daily.  . Multiple Vitamins-Minerals (MENS MULTI VITAMIN & MINERAL PO) Take 1 tablet by mouth daily.  . Multiple Vitamins-Minerals (PRESERVISION AREDS) TABS Take 1  tablet by mouth 2 (two) times daily.  . Tamsulosin HCl (FLOMAX) 0.4 MG CAPS Take 0.4 mg by mouth daily. Reported on 12/08/2015     Allergies:   Carvedilol   Social History   Socioeconomic History  . Marital status: Married    Spouse name: Not on file  . Number of children: Not on file  . Years of education: Not on file  . Highest education level: Not on file  Occupational History  . Not on file  Tobacco Use  . Smoking status: Never Smoker  . Smokeless tobacco: Never Used  Substance and Sexual Activity  . Alcohol use: No  . Drug use: No  . Sexual activity: Not on file  Other Topics Concern  . Not on file  Social  History Narrative  . Not on file   Social Determinants of Health   Financial Resource Strain:   . Difficulty of Paying Living Expenses:   Food Insecurity:   . Worried About Charity fundraiser in the Last Year:   . Arboriculturist in the Last Year:   Transportation Needs:   . Film/video editor (Medical):   Marland Kitchen Lack of Transportation (Non-Medical):   Physical Activity:   . Days of Exercise per Week:   . Minutes of Exercise per Session:   Stress:   . Feeling of Stress :   Social Connections:   . Frequency of Communication with Friends and Family:   . Frequency of Social Gatherings with Friends and Family:   . Attends Religious Services:   . Active Member of Clubs or Organizations:   . Attends Archivist Meetings:   Marland Kitchen Marital Status:      Family History: The patient's family history includes Alcoholism in his mother; Bone cancer in his father; Heart attack (age of onset: 52) in his father. There is no history of Colon cancer.  ROS:   Please see the history of present illness.    All other systems reviewed and are negative.  EKGs/Labs/Other Studies Reviewed:    The following studies were reviewed today: EKG:  EKG is ordered today.  The ekg ordered today demonstrates normal sinus rhythm, normal EKG, unchanged from prior, personally reviewed.  Recent Labs: 11/08/2019: ALT 36; BUN 17; Creatinine, Ser 1.21; Hemoglobin 15.1; Platelets 166; Potassium 4.1; Sodium 141; TSH 3.950  Recent Lipid Panel    Component Value Date/Time   CHOL 157 11/08/2019 0751   TRIG 119 11/08/2019 0751   HDL 36 (L) 11/08/2019 0751   CHOLHDL 4.4 11/08/2019 0751   CHOLHDL 4.6 12/09/2015 0502   VLDL 14 12/09/2015 0502   LDLCALC 99 11/08/2019 0751    Physical Exam:    VS:  BP 126/76   Pulse 72   Ht 5\' 11"  (1.803 m)   Wt 238 lb 3.2 oz (108 kg)   SpO2 95%   BMI 33.22 kg/m     Wt Readings from Last 3 Encounters:  01/06/20 238 lb 3.2 oz (108 kg)  05/14/19 237 lb 9.6 oz (107.8 kg)    12/21/18 234 lb (106.1 kg)     GEN: Well nourished, well developed in no acute distress HEENT: Normal NECK: No JVD; No carotid bruits LYMPHATICS: No lymphadenopathy CARDIAC: RRR, no murmurs, rubs, gallops RESPIRATORY:  Clear to auscultation without rales, wheezing or rhonchi  ABDOMEN: Soft, non-tender, non-distended MUSCULOSKELETAL:  No edema; No deformity  SKIN: Warm and dry NEUROLOGIC:  Alert and oriented x 3 PSYCHIATRIC:  Normal affect  ASSESSMENT:    1. Hypertensive heart disease without heart failure   2. Diastolic dysfunction without heart failure   3. Ascending aorta dilation ([facility])    PLAN:    In order of problems listed above:  1. Ascending aortic dilation - stable chest CTA at 41 mm in 11/2018 from prior scan in 2016, he is explaining length that considering his age height and male sex this is actually upper normal, will repeat again in 2 years scheduled for March 2022.  The patient is completely asymptomatic.  His blood pressure is well controlled. Discussed mainstays of treatment for aortic aneurysms including good BP control, regular low-moderate intensity physical activity, avoiding heavy lifting. 2. Essential HTN -his eyelid swelling has improved will continue to same management. 3. 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  4. Obesity - exercising 3x/week. 5. Lipids his lipids are at goal, triglycerides 119, HDL 36 and LDL 99.  Normal LFTs.   Medication Adjustments/Labs and Tests Ordered: Current medicines are reviewed at length with the patient today.  Concerns regarding medicines are outlined above.  Orders Placed This Encounter  Procedures  . EKG 12-Lead   No orders of the defined types were placed in this encounter.   Patient Instructions  Medication Instructions:   Your physician recommends that you continue on your current medications as directed. Please refer to the Current Medication list given to you today.  *If you need a  refill on your cardiac medications before your next appointment, please call your pharmacy*    Follow-Up: At [facility], you and your health needs are our priority.  As part of our continuing mission to provide you with exceptional heart care, we have created designated Provider Care Teams.  These Care Teams include your primary Cardiologist (physician) and Advanced Practice Providers (APPs -  Physician Assistants and Nurse Practitioners) who all work together to provide you with the care you need, when you need it.  We recommend signing up for the patient portal called "MyChart".  Sign up information is provided on this After Visit Summary.  MyChart is used to connect with patients for Virtual Visits (Telemedicine).  Patients are able to view lab/test results, encounter notes, upcoming appointments, etc.  Non-urgent messages can be sent to your provider as well.   To learn more about what you can do with MyChart, go to [email or web address].    Your next appointment:   12 month(s)  The format for your next appointment:   In Person  Provider:   Ena Dawley, MD        Signed, Ena Dawley, MD  01/06/2020 10:18 AM    [place]

## 2020-01-19 DIAGNOSIS — G4733 Obstructive sleep apnea (adult) (pediatric): Secondary | ICD-10-CM | POA: Diagnosis not present

## 2020-02-14 DIAGNOSIS — G4733 Obstructive sleep apnea (adult) (pediatric): Secondary | ICD-10-CM | POA: Diagnosis not present

## 2020-02-17 DIAGNOSIS — M722 Plantar fascial fibromatosis: Secondary | ICD-10-CM | POA: Diagnosis not present

## 2020-02-17 DIAGNOSIS — M7732 Calcaneal spur, left foot: Secondary | ICD-10-CM | POA: Diagnosis not present

## 2020-02-19 DIAGNOSIS — G4733 Obstructive sleep apnea (adult) (pediatric): Secondary | ICD-10-CM | POA: Diagnosis not present

## 2020-02-24 DIAGNOSIS — M722 Plantar fascial fibromatosis: Secondary | ICD-10-CM | POA: Diagnosis not present

## 2020-02-29 DIAGNOSIS — G4733 Obstructive sleep apnea (adult) (pediatric): Secondary | ICD-10-CM | POA: Diagnosis not present

## 2020-03-02 DIAGNOSIS — M722 Plantar fascial fibromatosis: Secondary | ICD-10-CM | POA: Diagnosis not present

## 2020-03-02 DIAGNOSIS — M71572 Other bursitis, not elsewhere classified, left ankle and foot: Secondary | ICD-10-CM | POA: Diagnosis not present

## 2020-03-20 DIAGNOSIS — G4733 Obstructive sleep apnea (adult) (pediatric): Secondary | ICD-10-CM | POA: Diagnosis not present

## 2020-03-30 DIAGNOSIS — G4733 Obstructive sleep apnea (adult) (pediatric): Secondary | ICD-10-CM | POA: Diagnosis not present

## 2020-04-03 DIAGNOSIS — R7303 Prediabetes: Secondary | ICD-10-CM | POA: Diagnosis not present

## 2020-04-03 DIAGNOSIS — Z1211 Encounter for screening for malignant neoplasm of colon: Secondary | ICD-10-CM | POA: Diagnosis not present

## 2020-04-03 DIAGNOSIS — I10 Essential (primary) hypertension: Secondary | ICD-10-CM | POA: Diagnosis not present

## 2020-04-03 DIAGNOSIS — Z1389 Encounter for screening for other disorder: Secondary | ICD-10-CM | POA: Diagnosis not present

## 2020-04-03 DIAGNOSIS — N400 Enlarged prostate without lower urinary tract symptoms: Secondary | ICD-10-CM | POA: Diagnosis not present

## 2020-04-03 DIAGNOSIS — E7800 Pure hypercholesterolemia, unspecified: Secondary | ICD-10-CM | POA: Diagnosis not present

## 2020-04-03 DIAGNOSIS — Z1159 Encounter for screening for other viral diseases: Secondary | ICD-10-CM | POA: Diagnosis not present

## 2020-04-03 DIAGNOSIS — Z23 Encounter for immunization: Secondary | ICD-10-CM | POA: Diagnosis not present

## 2020-04-03 DIAGNOSIS — M79672 Pain in left foot: Secondary | ICD-10-CM | POA: Diagnosis not present

## 2020-04-03 DIAGNOSIS — Z0000 Encounter for general adult medical examination without abnormal findings: Secondary | ICD-10-CM | POA: Diagnosis not present

## 2020-04-19 DIAGNOSIS — M722 Plantar fascial fibromatosis: Secondary | ICD-10-CM | POA: Diagnosis not present

## 2020-04-20 DIAGNOSIS — G4733 Obstructive sleep apnea (adult) (pediatric): Secondary | ICD-10-CM | POA: Diagnosis not present

## 2020-04-30 DIAGNOSIS — G4733 Obstructive sleep apnea (adult) (pediatric): Secondary | ICD-10-CM | POA: Diagnosis not present

## 2020-05-03 DIAGNOSIS — M792 Neuralgia and neuritis, unspecified: Secondary | ICD-10-CM | POA: Diagnosis not present

## 2020-05-03 DIAGNOSIS — M79671 Pain in right foot: Secondary | ICD-10-CM | POA: Diagnosis not present

## 2020-05-03 DIAGNOSIS — L600 Ingrowing nail: Secondary | ICD-10-CM | POA: Diagnosis not present

## 2020-05-03 DIAGNOSIS — M19071 Primary osteoarthritis, right ankle and foot: Secondary | ICD-10-CM | POA: Diagnosis not present

## 2020-05-03 DIAGNOSIS — I739 Peripheral vascular disease, unspecified: Secondary | ICD-10-CM | POA: Diagnosis not present

## 2020-05-03 DIAGNOSIS — M19072 Primary osteoarthritis, left ankle and foot: Secondary | ICD-10-CM | POA: Diagnosis not present

## 2020-05-03 DIAGNOSIS — M216X9 Other acquired deformities of unspecified foot: Secondary | ICD-10-CM | POA: Diagnosis not present

## 2020-05-11 DIAGNOSIS — I70201 Unspecified atherosclerosis of native arteries of extremities, right leg: Secondary | ICD-10-CM | POA: Diagnosis not present

## 2020-05-11 DIAGNOSIS — I739 Peripheral vascular disease, unspecified: Secondary | ICD-10-CM | POA: Diagnosis not present

## 2020-05-11 DIAGNOSIS — I70212 Atherosclerosis of native arteries of extremities with intermittent claudication, left leg: Secondary | ICD-10-CM | POA: Diagnosis not present

## 2020-05-21 DIAGNOSIS — G4733 Obstructive sleep apnea (adult) (pediatric): Secondary | ICD-10-CM | POA: Diagnosis not present

## 2020-05-22 DIAGNOSIS — R69 Illness, unspecified: Secondary | ICD-10-CM | POA: Diagnosis not present

## 2020-06-06 DIAGNOSIS — Z0100 Encounter for examination of eyes and vision without abnormal findings: Secondary | ICD-10-CM | POA: Diagnosis not present

## 2020-06-06 DIAGNOSIS — H40003 Preglaucoma, unspecified, bilateral: Secondary | ICD-10-CM | POA: Diagnosis not present

## 2020-06-20 DIAGNOSIS — G4733 Obstructive sleep apnea (adult) (pediatric): Secondary | ICD-10-CM | POA: Diagnosis not present

## 2020-07-10 DIAGNOSIS — Z1159 Encounter for screening for other viral diseases: Secondary | ICD-10-CM | POA: Diagnosis not present

## 2020-07-13 DIAGNOSIS — K648 Other hemorrhoids: Secondary | ICD-10-CM | POA: Diagnosis not present

## 2020-07-13 DIAGNOSIS — D125 Benign neoplasm of sigmoid colon: Secondary | ICD-10-CM | POA: Diagnosis not present

## 2020-07-13 DIAGNOSIS — Z86010 Personal history of colonic polyps: Secondary | ICD-10-CM | POA: Diagnosis not present

## 2020-07-13 DIAGNOSIS — K635 Polyp of colon: Secondary | ICD-10-CM | POA: Diagnosis not present

## 2020-07-18 DIAGNOSIS — D125 Benign neoplasm of sigmoid colon: Secondary | ICD-10-CM | POA: Diagnosis not present

## 2020-07-18 DIAGNOSIS — K635 Polyp of colon: Secondary | ICD-10-CM | POA: Diagnosis not present

## 2020-07-21 DIAGNOSIS — G4733 Obstructive sleep apnea (adult) (pediatric): Secondary | ICD-10-CM | POA: Diagnosis not present

## 2020-07-25 DIAGNOSIS — H401111 Primary open-angle glaucoma, right eye, mild stage: Secondary | ICD-10-CM | POA: Diagnosis not present

## 2020-08-16 DIAGNOSIS — H02831 Dermatochalasis of right upper eyelid: Secondary | ICD-10-CM | POA: Diagnosis not present

## 2020-08-16 DIAGNOSIS — H0289 Other specified disorders of eyelid: Secondary | ICD-10-CM | POA: Diagnosis not present

## 2020-08-16 DIAGNOSIS — H0279 Other degenerative disorders of eyelid and periocular area: Secondary | ICD-10-CM | POA: Diagnosis not present

## 2020-08-16 DIAGNOSIS — H53483 Generalized contraction of visual field, bilateral: Secondary | ICD-10-CM | POA: Diagnosis not present

## 2020-08-16 DIAGNOSIS — H02423 Myogenic ptosis of bilateral eyelids: Secondary | ICD-10-CM | POA: Diagnosis not present

## 2020-08-16 DIAGNOSIS — H02834 Dermatochalasis of left upper eyelid: Secondary | ICD-10-CM | POA: Diagnosis not present

## 2020-08-20 DIAGNOSIS — G4733 Obstructive sleep apnea (adult) (pediatric): Secondary | ICD-10-CM | POA: Diagnosis not present

## 2020-08-28 DIAGNOSIS — H401111 Primary open-angle glaucoma, right eye, mild stage: Secondary | ICD-10-CM | POA: Diagnosis not present

## 2020-08-30 DIAGNOSIS — H53481 Generalized contraction of visual field, right eye: Secondary | ICD-10-CM | POA: Diagnosis not present

## 2020-08-30 DIAGNOSIS — H53482 Generalized contraction of visual field, left eye: Secondary | ICD-10-CM | POA: Diagnosis not present

## 2020-08-30 DIAGNOSIS — H53483 Generalized contraction of visual field, bilateral: Secondary | ICD-10-CM | POA: Diagnosis not present

## 2020-09-05 ENCOUNTER — Other Ambulatory Visit: Payer: Self-pay | Admitting: Cardiology

## 2020-09-22 ENCOUNTER — Telehealth: Payer: Self-pay | Admitting: *Deleted

## 2020-09-22 NOTE — Telephone Encounter (Signed)
Pt has been scheduled to see Kerin Ransom, NP, 09/26/2020 and clearance will be addressed at that time.  Will route back to the requesting surgeon's office to make them aware.

## 2020-09-22 NOTE — Telephone Encounter (Signed)
   [facility] Pre-operative Risk Assessment    HEARTCARE STAFF: - Please ensure there is not already an duplicate clearance open for this procedure. - Under Visit Info/Reason for Call, type in Other and utilize the format Clearance MM/DD/YY or Clearance TBD. Do not use dashes or single digits. - If request is for dental extraction, please clarify the # of teeth to be extracted.  Request for surgical clearance:  1. What type of surgery is being performed? B/L UPPER EYELID EXTERNAL PTOSIS REPAIR, B/L UPPER EYELID BELPHAROPLASTY   2. When is this surgery scheduled? 10/04/20   3. What type of clearance is required (medical clearance vs. Pharmacy clearance to hold med vs. Both)? MEDICAL  4. Are there any medications that need to be held prior to surgery and how long? ASA   5. Practice name and name of physician performing surgery? [facility]; DR. Letitia Caul [place]    6. What is the office phone number? [phone]   7.   What is the office fax number? [phone]  8.   Anesthesia type (None, local, MAC, general) ? MAC   Julaine Hua 09/22/2020, 9:23 AM  _________________________________________________________________   (provider comments below)

## 2020-09-22 NOTE — Telephone Encounter (Signed)
Primary Cardiologist:Katarina Meda Coffee, MD  Chart reviewed as part of pre-operative protocol coverage. Because of Curtis Warren past medical history and time since last visit, he/she will require a follow-up visit in order to better assess preoperative cardiovascular risk.  Pre-op covering staff: - Please schedule appointment and call patient to inform them. - Please contact requesting surgeon's office via preferred method (i.e, phone, fax) to inform them of need for appointment prior to surgery.  If applicable, this message will also be routed to pharmacy pool and/or primary cardiologist for input on holding anticoagulant/antiplatelet agent as requested below so that this information is available at time of patient's appointment.   Deberah Pelton, NP  09/22/2020, 1:11 PM

## 2020-09-26 ENCOUNTER — Other Ambulatory Visit: Payer: Self-pay

## 2020-09-26 ENCOUNTER — Encounter: Payer: Self-pay | Admitting: Cardiology

## 2020-09-26 ENCOUNTER — Ambulatory Visit (INDEPENDENT_AMBULATORY_CARE_PROVIDER_SITE_OTHER): Payer: Medicare HMO | Admitting: Cardiology

## 2020-09-26 VITALS — BP 100/70 | HR 82 | Ht 71.0 in | Wt 238.0 lb

## 2020-09-26 DIAGNOSIS — I10 Essential (primary) hypertension: Secondary | ICD-10-CM

## 2020-09-26 DIAGNOSIS — I119 Hypertensive heart disease without heart failure: Secondary | ICD-10-CM | POA: Diagnosis not present

## 2020-09-26 DIAGNOSIS — I7121 Aneurysm of the ascending aorta, without rupture: Secondary | ICD-10-CM

## 2020-09-26 DIAGNOSIS — I712 Thoracic aortic aneurysm, without rupture: Secondary | ICD-10-CM | POA: Diagnosis not present

## 2020-09-26 DIAGNOSIS — Z01818 Encounter for other preprocedural examination: Secondary | ICD-10-CM | POA: Diagnosis not present

## 2020-09-26 DIAGNOSIS — Z79899 Other long term (current) drug therapy: Secondary | ICD-10-CM

## 2020-09-26 NOTE — Patient Instructions (Addendum)
Medication Instructions:  Continue current medications  *If you need a refill on your cardiac medications before your next appointment, please call your pharmacy*   Lab Work: CBC and BMP  If you have labs (blood work) drawn today and your tests are completely normal, you will receive your results only by: Marland Kitchen MyChart Message (if you have MyChart) OR . A paper copy in the mail If you have any lab test that is abnormal or we need to change your treatment, we will call you to review the results.   Testing/Procedures: None Ordered   Follow-Up: At [facility], you and your health needs are our priority.  As part of our continuing mission to provide you with exceptional heart care, we have created designated Provider Care Teams.  These Care Teams include your primary Cardiologist (physician) and Advanced Practice Providers (APPs -  Physician Assistants and Nurse Practitioners) who all work together to provide you with the care you need, when you need it.  We recommend signing up for the patient portal called "MyChart".  Sign up information is provided on this After Visit Summary.  MyChart is used to connect with patients for Virtual Visits (Telemedicine).  Patients are able to view lab/test results, encounter notes, upcoming appointments, etc.  Non-urgent messages can be sent to your provider as well.   To learn more about what you can do with MyChart, go to [email or web address].    Your next appointment:   Thursday January 27th @ 8:45 am  The format for your next appointment:   Virtual Visit  Provider:    Kerin Ransom, [place]

## 2020-09-26 NOTE — Assessment & Plan Note (Signed)
Pt seen today for pe op clearance prior to eyelid surgery

## 2020-09-26 NOTE — Assessment & Plan Note (Signed)
4.1 CM.  F/U CTA in March 22 scheduled

## 2020-09-26 NOTE — Assessment & Plan Note (Signed)
Hypotensive today- new for him.  Asymptomatic.  Hold Lisinopril and lasix.  Check CBC and BMP.  F/U in 48 hours to review home B/P readings- will plan on granting clearance based on that f/u.

## 2020-09-26 NOTE — Progress Notes (Signed)
Cardiology Office Note:    Date:  09/26/2020   ID:  Curtis Warren, DOB 03-Mar-1956, MRN [id number]  PCP:  Carol Ada, MD  Cardiologist:  Ena Dawley, MD  Electrophysiologist:  None   Referring MD: Carol Ada, MD   No chief complaint on file. Pre op clearance  History of Present Illness:    Curtis Warren is a 65 y.o. male with a hx of a borderline ascending aortic aneurysm who is followed by Dr. Meda Coffee. He has had a prior low risk Myoview in 2017. Echocardiogram in 2017 showed an EF of 65 to 70% with grade 1 diastolic dysfunction. His last chest CT was March 2020 and showed his aneurysm to be 4.1 cm which was stable. The plan is for a 2-year follow-up.  Patient seen in the office today for preop clearance prior to eyelid surgery. Since we saw him last he says he has been doing well. He denies any unusual chest pain or shortness of breath. He walks 1 to 2 miles a day. He is not had syncope or dizziness.  The patient's blood pressure in the office was low, I got 92 systolic in both arms. He tells me this is new for him and in reviewing prior office blood pressures this does appear to be the case.  Past Medical History:  Diagnosis Date  . Back pain   . BPH (benign prostatic hyperplasia)   . 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 Dilatation of thoracic aorta ([place])   . Essential hypertension   . Obesity   . Pre-diabetes     Past Surgical History:  Procedure Laterality Date  . COLONOSCOPY    . HEMORRHOID SURGERY    . POLYPECTOMY      Current Medications: Current Meds  Medication Sig  . aspirin EC 81 MG tablet Take 81 mg by mouth daily.  . cetirizine (ZYRTEC) 10 MG tablet Take 10 mg by mouth as needed.   . Coenzyme Q10 (CO Q 10 PO) Take 1 tablet by mouth daily.  . finasteride (PROSCAR) 5 MG tablet Take 5 mg by mouth daily.  . furosemide (LASIX) 20 MG tablet TAKE 1 TABLET BY MOUTH EVERY DAY  . lisinopril (PRINIVIL,ZESTRIL) 5 MG tablet Take 1 tablet  (5 mg total) by mouth daily.  . Magnesium 500 MG TABS Take 500 mg by mouth daily.  . Multiple Vitamins-Minerals (MENS MULTI VITAMIN & MINERAL PO) Take 1 tablet by mouth daily.  . Multiple Vitamins-Minerals (PRESERVISION AREDS) TABS Take 1 tablet by mouth 2 (two) times daily.  . Tamsulosin HCl (FLOMAX) 0.4 MG CAPS Take 0.4 mg by mouth daily. Reported on 12/08/2015     Allergies:   Carvedilol   Social History   Socioeconomic History  . Marital status: Married    Spouse name: Not on file  . Number of children: Not on file  . Years of education: Not on file  . Highest education level: Not on file  Occupational History  . Not on file  Tobacco Use  . Smoking status: Never Smoker  . Smokeless tobacco: Never Used  Substance and Sexual Activity  . Alcohol use: No  . Drug use: No  . Sexual activity: Not on file  Other Topics Concern  . Not on file  Social History Narrative  . Not on file   Social Determinants of Health   Financial Resource Strain: Not on file  Food Insecurity: Not on file  Transportation Needs: Not on file  Physical Activity:  Not on file  Stress: Not on file  Social Connections: Not on file     Family History: The patient's family history includes Alcoholism in his mother; Bone cancer in his father; Heart attack (age of onset: 62) in his father. There is no history of Colon cancer.  ROS:   Please see the history of present illness.     All other systems reviewed and are negative.  EKGs/Labs/Other Studies Reviewed:    The following studies were reviewed today: Chest CTA 11/20/2018- IMPRESSION: 1. At the level of the sinotubular junction, the aorta has a maximum transverse diameter of 4.1 cm, essentially stable from prior MR. Maximum transverse diameter of the ascending aorta at the main pulmonary outflow tract level is 3.9 x 3.7 cm. No dissection evident. No pulmonary embolus.  EKG:  EKG is ordered today.  The ekg ordered today demonstrates NSR, HR  82  Recent Labs: 11/08/2019: ALT 36; BUN 17; Creatinine, Ser 1.21; Hemoglobin 15.1; Platelets 166; Potassium 4.1; Sodium 141; TSH 3.950  Recent Lipid Panel    Component Value Date/Time   CHOL 157 11/08/2019 0751   TRIG 119 11/08/2019 0751   HDL 36 (L) 11/08/2019 0751   CHOLHDL 4.4 11/08/2019 0751   CHOLHDL 4.6 12/09/2015 0502   VLDL 14 12/09/2015 0502   LDLCALC 99 11/08/2019 0751    Physical Exam:    VS:  BP 100/70   Pulse 82   Ht 5\' 11"  (1.803 m)   Wt 238 lb (108 kg)   BMI 33.19 kg/m     Wt Readings from Last 3 Encounters:  09/26/20 238 lb (108 kg)  01/06/20 238 lb 3.2 oz (108 kg)  05/14/19 237 lb 9.6 oz (107.8 kg)   b/P by me 92 systolic in both arms with regular cuff  GEN: Overweight male, , well developed in no acute distress HEENT: Normal NECK: No JVD; No carotid bruits LYMPHATICS: No lymphadenopathy CARDIAC: RRR, no murmurs, rubs, gallops RESPIRATORY:  Clear to auscultation without rales, wheezing or rhonchi  ABDOMEN: Soft,  non-distended MUSCULOSKELETAL:  No edema; No deformity  SKIN: Warm and dry NEUROLOGIC:  Alert and oriented x 3 PSYCHIATRIC:  Normal affect   ASSESSMENT:    Pre-operative clearance Pt seen today for pe op clearance prior to eyelid surgery  Hypertensive heart disease Hypotensive today- new for him.  Asymptomatic.  Hold Lisinopril and lasix.  Check CBC and BMP.  F/U in 48 hours to review home B/P readings- will plan on granting clearance based on that f/u.   Ascending aortic aneurysm (HCC) 4.1 CM.  F/U CTA in March 22 scheduled  PLAN:    Hold Lasix and Lisinopril- check labs- f/u 48 hours.   Medication Adjustments/Labs and Tests Ordered: Current medicines are reviewed at length with the patient today.  Concerns regarding medicines are outlined above.  Orders Placed This Encounter  Procedures  . CBC  . Basic Metabolic Panel (BMET)  . EKG 12-Lead   No orders of the defined types were placed in this encounter.   Patient  Instructions  Medication Instructions:  Continue current medications  *If you need a refill on your cardiac medications before your next appointment, please call your pharmacy*   Lab Work: CBC and BMP  If you have labs (blood work) drawn today and your tests are completely normal, you will receive your results only by: Marland Kitchen MyChart Message (if you have MyChart) OR . A paper copy in the mail If you have any lab test that is  abnormal or we need to change your treatment, we will call you to review the results.   Testing/Procedures: None Ordered   Follow-Up: At [facility], you and your health needs are our priority.  As part of our continuing mission to provide you with exceptional heart care, we have created designated Provider Care Teams.  These Care Teams include your primary Cardiologist (physician) and Advanced Practice Providers (APPs -  Physician Assistants and Nurse Practitioners) who all work together to provide you with the care you need, when you need it.  We recommend signing up for the patient portal called "MyChart".  Sign up information is provided on this After Visit Summary.  MyChart is used to connect with patients for Virtual Visits (Telemedicine).  Patients are able to view lab/test results, encounter notes, upcoming appointments, etc.  Non-urgent messages can be sent to your provider as well.   To learn more about what you can do with MyChart, go to [email or web address].    Your next appointment:   Thursday January 27th @ 8:45 am  The format for your next appointment:   Virtual Visit  Provider:    Kerin Ransom, [place]        Signed, Kerin Ransom, [place]  09/26/2020 2:30 PM    [place]

## 2020-09-27 LAB — BASIC METABOLIC PANEL
BUN/Creatinine Ratio: 13 (ref 10–24)
BUN: 17 mg/dL (ref 8–27)
CO2: 25 mmol/L (ref 20–29)
Calcium: 9.7 mg/dL (ref 8.6–10.2)
Chloride: 101 mmol/L (ref 96–106)
Creatinine, Ser: 1.3 mg/dL — ABNORMAL HIGH (ref 0.76–1.27)
GFR calc Af Amer: 66 mL/min/{1.73_m2} (ref >59)
GFR calc non Af Amer: 57 mL/min/{1.73_m2} — ABNORMAL LOW (ref >59)
Glucose: 132 mg/dL — ABNORMAL HIGH (ref 65–99)
Potassium: 4.3 mmol/L (ref 3.5–5.2)
Sodium: 144 mmol/L (ref 134–144)

## 2020-09-27 LAB — CBC
Hematocrit: 46.6 % (ref 37.5–51.0)
Hemoglobin: 16.1 g/dL (ref 13.0–17.7)
MCH: 31.1 pg (ref 26.6–33.0)
MCHC: 34.5 g/dL (ref 31.5–35.7)
MCV: 90 fL (ref 79–97)
Platelets: 178 10*3/uL (ref 150–450)
RBC: 5.17 x10E6/uL (ref 4.14–5.80)
RDW: 11.7 % (ref 11.6–15.4)
WBC: 8.5 10*3/uL (ref 3.4–10.8)

## 2020-09-28 ENCOUNTER — Encounter: Payer: Self-pay | Admitting: Cardiology

## 2020-09-28 ENCOUNTER — Telehealth (INDEPENDENT_AMBULATORY_CARE_PROVIDER_SITE_OTHER): Payer: Medicare HMO | Admitting: Cardiology

## 2020-09-28 VITALS — BP 128/72 | Ht 71.0 in | Wt 238.0 lb

## 2020-09-28 DIAGNOSIS — N289 Disorder of kidney and ureter, unspecified: Secondary | ICD-10-CM | POA: Insufficient documentation

## 2020-09-28 DIAGNOSIS — I959 Hypotension, unspecified: Secondary | ICD-10-CM | POA: Insufficient documentation

## 2020-09-28 DIAGNOSIS — I7121 Aneurysm of the ascending aorta, without rupture: Secondary | ICD-10-CM

## 2020-09-28 DIAGNOSIS — G4733 Obstructive sleep apnea (adult) (pediatric): Secondary | ICD-10-CM | POA: Diagnosis not present

## 2020-09-28 DIAGNOSIS — I712 Thoracic aortic aneurysm, without rupture: Secondary | ICD-10-CM | POA: Diagnosis not present

## 2020-09-28 DIAGNOSIS — I952 Hypotension due to drugs: Secondary | ICD-10-CM | POA: Diagnosis not present

## 2020-09-28 DIAGNOSIS — Z01818 Encounter for other preprocedural examination: Secondary | ICD-10-CM

## 2020-09-28 NOTE — Progress Notes (Signed)
Virtual Visit via Video Note   This visit type was conducted due to national recommendations for restrictions regarding the COVID-19 Pandemic (e.g. social distancing) in an effort to limit this patient's exposure and mitigate transmission in our community.  Due to his co-morbid illnesses, this patient is at least at moderate risk for complications without adequate follow up.  This format is felt to be most appropriate for this patient at this time.  All issues noted in this document were discussed and addressed.  A limited physical exam was performed with this format.  Please refer to the patient's chart for his consent to telehealth for [facility].       Date:  09/28/2020   ID:  Curtis Warren, DOB 1956-05-28, MRN [id number] The patient was identified using 2 identifiers.  Patient Location: Home Provider Location: Office/Clinic  PCP:  Carol Ada, MD  Cardiologist:  Ena Dawley, MD  Electrophysiologist:  None   Evaluation Performed:  Follow-Up Visit  Chief Complaint:  none  History of Present Illness:    Curtis Warren is a pleasant 66 y.o. male with a  hx of a borderline ascending aortic aneurysm who is followed by Dr. Meda Coffee. He has had a prior low risk Myoview in 2017. Echocardiogram in 2017 showed an EF of 65 to 70% with grade 1 diastolic dysfunction. He was put on low dose Lasix then for an elevated LVEDP. His last chest CT was March 2020 and showed his aneurysm to be 4.1 cm which was stable. The plan is for a 2-year follow-up.  Patient seen in the office 09/26/2020 for preop clearance prior to eyelid surgery. Since we had seen him last he reported he had been doing well. He denied any unusual chest pain or shortness of breath. He walks 1 to 2 miles a day. He is not had syncope or dizziness.  His B/P in the office that day was low- 92 systolic in both arms.  He was asymptomatic with this.  I had him stop his Lisinopril 5 mg and Lasix 20 mg. Labs were done and  he had mild renal insufficiency, new for him.   He has monitored his B/P at home over the last few days and was contacted today for follow up.  His B/P at home is running 120/60 average.    The patient does not have symptoms concerning for COVID-19 infection (fever, chills, cough, or new shortness of breath).    Past Medical History:  Diagnosis Date  . Back pain   . BPH (benign prostatic hyperplasia)   . 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 Dilatation of thoracic aorta ([place])   . Essential hypertension   . Obesity   . Pre-diabetes    Past Surgical History:  Procedure Laterality Date  . COLONOSCOPY    . HEMORRHOID SURGERY    . POLYPECTOMY       Current Meds  Medication Sig  . aspirin EC 81 MG tablet Take 81 mg by mouth daily.  . cetirizine (ZYRTEC) 10 MG tablet Take 10 mg by mouth as needed.   . Coenzyme Q10 (CO Q 10) 100 MG CAPS 1 capsule with a meal  . finasteride (PROSCAR) 5 MG tablet Take 5 mg by mouth daily.  . furosemide (LASIX) 20 MG tablet TAKE 1 TABLET BY MOUTH EVERY DAY  . HYDROcodone-acetaminophen (NORCO) 10-325 MG tablet hydrocodone 10 mg-acetaminophen 325 mg tablet  TAKE 1 TABLET BY MOUTH TWICE A DAY AS NEEDED FOR PAIN FOR 30  DAYS  . lisinopril (ZESTRIL) 10 MG tablet TAKE 1/2 TABLET BY MOUTH ONCE A DAY  . Tamsulosin HCl (FLOMAX) 0.4 MG CAPS Take 0.4 mg by mouth daily. Reported on 12/08/2015     Allergies:   Carvedilol   Social History   Tobacco Use  . Smoking status: Never Smoker  . Smokeless tobacco: Never Used  Substance Use Topics  . Alcohol use: No  . Drug use: No     Family Hx: The patient's family history includes Alcoholism in his mother; Bone cancer in his father; Heart attack (age of onset: 32) in his father. There is no history of Colon cancer.  ROS:   Please see the history of present illness.    All other systems reviewed and are negative.   Prior CV studies:   The following studies were reviewed today:  Echo 12/12/2017- Study  Conclusions   - Left ventricle: The cavity size was normal. Systolic function was  normal. The estimated ejection fraction was in the range of 60%  to 65%. Wall motion was normal; there were no regional wall  motion abnormalities. Features are consistent with a pseudonormal  left ventricular filling pattern, with concomitant abnormal  relaxation and increased filling pressure (grade 2 diastolic  dysfunction).  - Aorta: Aortic root dimension: 39 mm (ED). Ascending aortic  diameter: 40 mm (S).  - Aortic root: The aortic root was mildly dilated.  - Ascending aorta: The ascending aorta was mildly dilated.  - Mitral valve: There was trivial regurgitation.  - Left atrium: The atrium was mildly dilated.  - Right ventricle: The cavity size was mildly dilated. Wall  thickness was normal.  - Tricuspid valve: There was mild regurgitation.   Labs/Other Tests and Data Reviewed:    EKG:  An ECG dated 09/26/2020 was personally reviewed today and demonstrated:  NSR 82  Recent Labs: 11/08/2019: ALT 36; TSH 3.950 09/26/2020: BUN 17; Creatinine, Ser 1.30; Hemoglobin 16.1; Platelets 178; Potassium 4.3; Sodium 144   Recent Lipid Panel Lab Results  Component Value Date/Time   CHOL 157 11/08/2019 07:51 AM   TRIG 119 11/08/2019 07:51 AM   HDL 36 (L) 11/08/2019 07:51 AM   CHOLHDL 4.4 11/08/2019 07:51 AM   CHOLHDL 4.6 12/09/2015 05:02 AM   LDLCALC 99 11/08/2019 07:51 AM    Wt Readings from Last 3 Encounters:  09/28/20 238 lb (108 kg)  09/26/20 238 lb (108 kg)  01/06/20 238 lb 3.2 oz (108 kg)     Risk Assessment/Calculations:      Objective:    Vital Signs:  BP 128/72   Ht 5\' 11"  (1.803 m)   Wt 238 lb (108 kg)   BMI 33.19 kg/m    VITAL SIGNS:  reviewed  ASSESSMENT & PLAN:     Pre-operative clearance Pt will be cleared- I will fax clearance via Epic.  Hypotension- B/P stable off Lasix 20 mg and Lisinopril 5 mg. I have asked him to monitor his B/P- goal less than  135/80.  He knows we may have to add another agent in the future if his B/P drifts up- his HR is low 60 and he had mild renal insufficiency on ACE and Lasix- consider Amlodipine if needed.  Renal insufficiency Slight bump in his SCr- f/u one month  OSA- Compliant with C-pap  Ascending aortic aneurysm (HCC) 4.1 CM.  F/U CTA in March 22 scheduled       COVID-19 Education: The signs and symptoms of COVID-19 were discussed with the patient and  how to seek care for testing (follow up with PCP or arrange E-visit).  The importance of social distancing was discussed today.  Time:   Today, I have spent 10 minutes with the patient with telehealth technology discussing the above problems.     Medication Adjustments/Labs and Tests Ordered: Current medicines are reviewed at length with the patient today.  Concerns regarding medicines are outlined above.   Tests Ordered: No orders of the defined types were placed in this encounter.   Medication Changes: No orders of the defined types were placed in this encounter.   Follow Up:  In Person with Dr Meda Coffee after his CT in March  Signed, Cainen Burnham, [place]  09/28/2020 8:16 AM    [place]

## 2020-09-28 NOTE — Telephone Encounter (Signed)
   Primary Cardiologist: Ena Dawley, MD  Chart reviewed and patient contacted for a video visit as part of pre-operative protocol coverage. Given past medical history and time since last visit, based on ACC/AHA guidelines, Curtis Warren would be at acceptable risk for the planned procedure without further cardiovascular testing.   OK to hold aspirin if needed.  The patient was advised that if he develops new symptoms prior to surgery to contact our office to arrange for a follow-up visit, and he verbalized understanding.  I will route this recommendation to the requesting party via Epic fax function and remove from pre-op pool.  Please call with questions.  Kerin Ransom, PA-C 09/28/2020, 8:32 AM

## 2020-09-28 NOTE — Patient Instructions (Signed)
Medication Instructions:  Stop Lasix and Lisinopril *If you need a refill on your cardiac medications before your next appointment, please call your pharmacy*   Lab Work: BMP ( Scheduled for November 10, 2020 10:30 AM ) If you have labs (blood work) drawn today and your tests are completely normal, you will receive your results only by: Marland Kitchen MyChart Message (if you have MyChart) OR . A paper copy in the mail If you have any lab test that is abnormal or we need to change your treatment, we will call you to review the results.   Testing/Procedures: No Testing   Follow-Up: At [facility], you and your health needs are our priority.  As part of our continuing mission to provide you with exceptional heart care, we have created designated Provider Care Teams.  These Care Teams include your primary Cardiologist (physician) and Advanced Practice Providers (APPs -  Physician Assistants and Nurse Practitioners) who all work together to provide you with the care you need, when you need it.    Your next appointment:   2 months  The format for your next appointment:   In Person  Provider:   Ena Dawley, MD

## 2020-10-04 DIAGNOSIS — H02831 Dermatochalasis of right upper eyelid: Secondary | ICD-10-CM | POA: Diagnosis not present

## 2020-10-04 DIAGNOSIS — H53482 Generalized contraction of visual field, left eye: Secondary | ICD-10-CM | POA: Diagnosis not present

## 2020-10-04 DIAGNOSIS — H02423 Myogenic ptosis of bilateral eyelids: Secondary | ICD-10-CM | POA: Diagnosis not present

## 2020-10-04 DIAGNOSIS — H53483 Generalized contraction of visual field, bilateral: Secondary | ICD-10-CM | POA: Diagnosis not present

## 2020-10-04 DIAGNOSIS — H02834 Dermatochalasis of left upper eyelid: Secondary | ICD-10-CM | POA: Diagnosis not present

## 2020-10-04 DIAGNOSIS — H53481 Generalized contraction of visual field, right eye: Secondary | ICD-10-CM | POA: Diagnosis not present

## 2020-10-10 DIAGNOSIS — R7303 Prediabetes: Secondary | ICD-10-CM | POA: Diagnosis not present

## 2020-10-10 DIAGNOSIS — I10 Essential (primary) hypertension: Secondary | ICD-10-CM | POA: Diagnosis not present

## 2020-10-10 DIAGNOSIS — G4733 Obstructive sleep apnea (adult) (pediatric): Secondary | ICD-10-CM | POA: Diagnosis not present

## 2020-10-10 DIAGNOSIS — G8929 Other chronic pain: Secondary | ICD-10-CM | POA: Diagnosis not present

## 2020-10-10 DIAGNOSIS — E7800 Pure hypercholesterolemia, unspecified: Secondary | ICD-10-CM | POA: Diagnosis not present

## 2020-10-10 DIAGNOSIS — I712 Thoracic aortic aneurysm, without rupture: Secondary | ICD-10-CM | POA: Diagnosis not present

## 2020-10-10 DIAGNOSIS — N400 Enlarged prostate without lower urinary tract symptoms: Secondary | ICD-10-CM | POA: Diagnosis not present

## 2020-10-17 ENCOUNTER — Telehealth: Payer: Medicare HMO | Admitting: Cardiology

## 2020-11-01 DIAGNOSIS — M792 Neuralgia and neuritis, unspecified: Secondary | ICD-10-CM | POA: Diagnosis not present

## 2020-11-01 DIAGNOSIS — M79671 Pain in right foot: Secondary | ICD-10-CM | POA: Diagnosis not present

## 2020-11-01 DIAGNOSIS — L600 Ingrowing nail: Secondary | ICD-10-CM | POA: Diagnosis not present

## 2020-11-10 ENCOUNTER — Other Ambulatory Visit: Payer: Self-pay

## 2020-11-10 ENCOUNTER — Other Ambulatory Visit: Payer: Medicare HMO | Admitting: *Deleted

## 2020-11-10 DIAGNOSIS — I7121 Aneurysm of the ascending aorta, without rupture: Secondary | ICD-10-CM

## 2020-11-10 DIAGNOSIS — I5189 Other ill-defined heart diseases: Secondary | ICD-10-CM

## 2020-11-10 DIAGNOSIS — I77810 Thoracic aortic ectasia: Secondary | ICD-10-CM | POA: Diagnosis not present

## 2020-11-10 DIAGNOSIS — I712 Thoracic aortic aneurysm, without rupture: Secondary | ICD-10-CM

## 2020-11-10 DIAGNOSIS — I119 Hypertensive heart disease without heart failure: Secondary | ICD-10-CM | POA: Diagnosis not present

## 2020-11-10 LAB — BASIC METABOLIC PANEL
BUN/Creatinine Ratio: 9 — ABNORMAL LOW (ref 10–24)
BUN: 11 mg/dL (ref 8–27)
CO2: 23 mmol/L (ref 20–29)
Calcium: 9.3 mg/dL (ref 8.6–10.2)
Chloride: 103 mmol/L (ref 96–106)
Creatinine, Ser: 1.17 mg/dL (ref 0.76–1.27)
Glucose: 114 mg/dL — ABNORMAL HIGH (ref 65–99)
Potassium: 4 mmol/L (ref 3.5–5.2)
Sodium: 142 mmol/L (ref 134–144)
eGFR: 69 mL/min/{1.73_m2} (ref >59)

## 2020-11-20 ENCOUNTER — Ambulatory Visit (INDEPENDENT_AMBULATORY_CARE_PROVIDER_SITE_OTHER)
Admission: RE | Admit: 2020-11-20 | Discharge: 2020-11-20 | Disposition: A | Discharge status: Home / Self Care | Payer: Medicare HMO | Source: Ambulatory Visit | Attending: Cardiology | Admitting: Cardiology

## 2020-11-20 ENCOUNTER — Other Ambulatory Visit: Payer: Self-pay

## 2020-11-20 DIAGNOSIS — E041 Nontoxic single thyroid nodule: Secondary | ICD-10-CM | POA: Diagnosis not present

## 2020-11-20 DIAGNOSIS — I7121 Aneurysm of the ascending aorta, without rupture: Secondary | ICD-10-CM

## 2020-11-20 DIAGNOSIS — I712 Thoracic aortic aneurysm, without rupture: Secondary | ICD-10-CM | POA: Diagnosis not present

## 2020-11-20 DIAGNOSIS — E042 Nontoxic multinodular goiter: Secondary | ICD-10-CM | POA: Diagnosis not present

## 2020-11-20 DIAGNOSIS — N281 Cyst of kidney, acquired: Secondary | ICD-10-CM | POA: Diagnosis not present

## 2020-11-20 MED ORDER — IOHEXOL 350 MG/ML SOLN
100.0000 mL | Freq: Once | INTRAVENOUS | Status: AC | PRN
  Administered 2020-11-20: 100 mL via INTRAVENOUS

## 2020-11-23 ENCOUNTER — Telehealth: Payer: Self-pay | Admitting: *Deleted

## 2020-11-23 DIAGNOSIS — I77810 Thoracic aortic ectasia: Secondary | ICD-10-CM

## 2020-11-23 DIAGNOSIS — I712 Thoracic aortic aneurysm, without rupture: Secondary | ICD-10-CM

## 2020-11-23 DIAGNOSIS — I7121 Aneurysm of the ascending aorta, without rupture: Secondary | ICD-10-CM

## 2020-11-23 NOTE — Telephone Encounter (Signed)
-----   Message from Dorothy Spark, MD sent at 11/21/2020  9:39 AM EDT ----- Dilated 4.0 cm diameter ascending thoracic aorta, previously 3.9 cm, stable to minimally increased. Recommend annual imaging followup by CTA or MRA.

## 2020-11-23 NOTE — Telephone Encounter (Signed)
Endorsed to the pt his CT Angio Chest Aorta results and recommendations per Dr. Meda Coffee, for him to have this repeated in one year for follow-up.  Informed the pt that I will place the order in Epic and send a message to our CT Scheduler to call him back and arrange this test for one year out.  Order will be placed under Dr. Jacolyn Reedy name, being the pt will be establishing care with her, since Dr. Meda Coffee is leaving.  Pt verbalized understanding and agrees with this plan.

## 2020-11-27 DIAGNOSIS — H401111 Primary open-angle glaucoma, right eye, mild stage: Secondary | ICD-10-CM | POA: Diagnosis not present

## 2020-11-29 DIAGNOSIS — I739 Peripheral vascular disease, unspecified: Secondary | ICD-10-CM | POA: Diagnosis not present

## 2020-11-29 DIAGNOSIS — M205X1 Other deformities of toe(s) (acquired), right foot: Secondary | ICD-10-CM | POA: Diagnosis not present

## 2020-11-29 DIAGNOSIS — L600 Ingrowing nail: Secondary | ICD-10-CM | POA: Diagnosis not present

## 2020-11-29 DIAGNOSIS — M792 Neuralgia and neuritis, unspecified: Secondary | ICD-10-CM | POA: Diagnosis not present

## 2021-01-08 DIAGNOSIS — H53482 Generalized contraction of visual field, left eye: Secondary | ICD-10-CM | POA: Diagnosis not present

## 2021-01-08 DIAGNOSIS — H53481 Generalized contraction of visual field, right eye: Secondary | ICD-10-CM | POA: Diagnosis not present

## 2021-01-08 DIAGNOSIS — H53483 Generalized contraction of visual field, bilateral: Secondary | ICD-10-CM | POA: Diagnosis not present

## 2021-01-10 ENCOUNTER — Ambulatory Visit: Payer: Medicare HMO | Admitting: Cardiology

## 2021-01-14 NOTE — Progress Notes (Deleted)
Cardiology Office Note:    Date:  01/14/2021   ID:  Curtis Warren, DOB 04-06-1956, MRN [id number]  PCP:  Carol Ada, MD   [facility] Providers Cardiologist:  Ena Dawley, MD (Inactive) {  Referring MD: Carol Ada, MD    History of Present Illness:    Curtis Warren is a 65 y.o. male with a hx of HTN and borderline ascending aortic aneurysm who was previously followed by Dr. Meda Coffee who now returns to clinic for follow-up.  Per review of the record, he had a prior low risk Myoview in 2017. TTE in 2017 showed an EF of 65 to 70% with grade 1 diastolic dysfunction. He was put on low dose Lasix then for an elevated LVEDP. His last CTA chest on 11/20/20 demonstrated a dilated ascending 4.0cm. Was recommended for annual follow-up.  Past Medical History:  Diagnosis Date  . Back pain   . BPH (benign prostatic hyperplasia)   . 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 Dilatation of thoracic aorta ([place])   . Essential hypertension   . Obesity   . Pre-diabetes     Past Surgical History:  Procedure Laterality Date  . COLONOSCOPY    . HEMORRHOID SURGERY    . POLYPECTOMY      Current Medications: No outpatient medications have been marked as taking for the 01/17/21 encounter (Appointment) with Freada Bergeron, MD.     Allergies:   Carvedilol   Social History   Socioeconomic History  . Marital status: Married    Spouse name: Not on file  . Number of children: Not on file  . Years of education: Not on file  . Highest education level: Not on file  Occupational History  . Not on file  Tobacco Use  . Smoking status: Never Smoker  . Smokeless tobacco: Never Used  Substance and Sexual Activity  . Alcohol use: No  . Drug use: No  . Sexual activity: Not on file  Other Topics Concern  . Not on file  Social History Narrative  . Not on file   Social Determinants of Health   Financial Resource Strain: Not on file  Food Insecurity: Not on file   Transportation Needs: Not on file  Physical Activity: Not on file  Stress: Not on file  Social Connections: Not on file     Family History: The patient's ***family history includes Alcoholism in his mother; Bone cancer in his father; Heart attack (age of onset: 13) in his father. There is no history of Colon cancer.  ROS:   Please see the history of present illness.    *** All other systems reviewed and are negative.  EKGs/Labs/Other Studies Reviewed:    The following studies were reviewed today: TTE Dec 21, 2017: Study Conclusions   - Left ventricle: The cavity size was normal. Systolic function was  normal. The estimated ejection fraction was in the range of 60%  to 65%. Wall motion was normal; there were no regional wall  motion abnormalities. Features are consistent with a pseudonormal  left ventricular filling pattern, with concomitant abnormal  relaxation and increased filling pressure (grade 2 diastolic  dysfunction).  - Aorta: Aortic root dimension: 39 mm (ED). Ascending aortic  diameter: 40 mm (S).  - Aortic root: The aortic root was mildly dilated.  - Ascending aorta: The ascending aorta was mildly dilated.  - Mitral valve: There was trivial regurgitation.  - Left atrium: The atrium was mildly dilated.  - Right ventricle: The cavity  size was mildly dilated. Wall  thickness was normal.  - Tricuspid valve: There was mild regurgitation.  CTA 11/20/20: IMPRESSION: 1. Dilated 4.0 cm diameter ascending thoracic aorta, previously 3.9 cm, stable to minimally increased. Recommend annual imaging followup by CTA or MRA. This recommendation follows 2010 ACCF/AHA/AATS/ACR/ASA/SCA/SCAI/SIR/STS/SVM Guidelines for the Diagnosis and Management of Patients with Thoracic Aortic Disease. Circulation. 2010; 121: Z329-J242. Aortic aneurysm NOS (ICD10-I71.9). 2. No active pulmonary disease.   EKG:  EKG is *** ordered today.  The ekg ordered today demonstrates  ***  Recent Labs: 09/26/2020: Hemoglobin 16.1; Platelets 178 11/10/2020: BUN 11; Creatinine, Ser 1.17; Potassium 4.0; Sodium 142  Recent Lipid Panel    Component Value Date/Time   CHOL 157 11/08/2019 0751   TRIG 119 11/08/2019 0751   HDL 36 (L) 11/08/2019 0751   CHOLHDL 4.4 11/08/2019 0751   CHOLHDL 4.6 12/09/2015 0502   VLDL 14 12/09/2015 0502   LDLCALC 99 11/08/2019 0751     Risk Assessment/Calculations:   {Does this patient have ATRIAL FIBRILLATION?:[phone]}   Physical Exam:    VS:  There were no vitals taken for this visit.    Wt Readings from Last 3 Encounters:  09/28/20 238 lb (108 kg)  09/26/20 238 lb (108 kg)  01/06/20 238 lb 3.2 oz (108 kg)     GEN: *** Well nourished, well developed in no acute distress HEENT: Normal NECK: No JVD; No carotid bruits LYMPHATICS: No lymphadenopathy CARDIAC: ***RRR, no murmurs, rubs, gallops RESPIRATORY:  Clear to auscultation without rales, wheezing or rhonchi  ABDOMEN: Soft, non-tender, non-distended MUSCULOSKELETAL:  No edema; No deformity  SKIN: Warm and dry NEUROLOGIC:  Alert and oriented x 3 PSYCHIATRIC:  Normal affect   ASSESSMENT:    No diagnosis found. PLAN:    In order of problems listed above:  #Mildly dilated ascending aorta: Measured 4.0cm on last CTA. Will need annual montoring. -Next CTA/MRA aorta needed 10/2021 -Continue ASA 81mg  daily  #HTN: Well controlled.  -Continue lisinopril 5mg  qdaily   {Are you ordering a CV Procedure (e.g. stress test, cath, DCCV, TEE, etc)?   Press F2        :[id number]}    Medication Adjustments/Labs and Tests Ordered: Current medicines are reviewed at length with the patient today.  Concerns regarding medicines are outlined above.  No orders of the defined types were placed in this encounter.  No orders of the defined types were placed in this encounter.   There are no Patient Instructions on file for this visit.   Signed, Freada Bergeron, MD  01/14/2021  4:26 PM    [facility]

## 2021-01-17 ENCOUNTER — Ambulatory Visit: Payer: Medicare HMO | Admitting: Cardiology

## 2021-01-17 ENCOUNTER — Encounter: Payer: Self-pay | Admitting: Cardiology

## 2021-01-17 ENCOUNTER — Other Ambulatory Visit: Payer: Self-pay

## 2021-01-17 VITALS — BP 124/78 | HR 60 | Ht 71.0 in | Wt 240.6 lb

## 2021-01-17 DIAGNOSIS — I712 Thoracic aortic aneurysm, without rupture: Secondary | ICD-10-CM | POA: Diagnosis not present

## 2021-01-17 DIAGNOSIS — I10 Essential (primary) hypertension: Secondary | ICD-10-CM

## 2021-01-17 DIAGNOSIS — Z01818 Encounter for other preprocedural examination: Secondary | ICD-10-CM | POA: Diagnosis not present

## 2021-01-17 DIAGNOSIS — I7121 Aneurysm of the ascending aorta, without rupture: Secondary | ICD-10-CM

## 2021-01-17 NOTE — Patient Instructions (Signed)
Medication Instructions:   Your physician recommends that you continue on your current medications as directed. Please refer to the Current Medication list given to you today.  *If you need a refill on your cardiac medications before your next appointment, please call your pharmacy*   Follow-Up: At [facility], you and your health needs are our priority.  As part of our continuing mission to provide you with exceptional heart care, we have created designated Provider Care Teams.  These Care Teams include your primary Cardiologist (physician) and Advanced Practice Providers (APPs -  Physician Assistants and Nurse Practitioners) who all work together to provide you with the care you need, when you need it.  We recommend signing up for the patient portal called "MyChart".  Sign up information is provided on this After Visit Summary.  MyChart is used to connect with patients for Virtual Visits (Telemedicine).  Patients are able to view lab/test results, encounter notes, upcoming appointments, etc.  Non-urgent messages can be sent to your provider as well.   To learn more about what you can do with MyChart, go to [email or web address].    Your next appointment:   1 year(s)  The format for your next appointment:   In Person  Provider:   Gwyndolyn Kaufman, MD   Other Instructions  PLEASE CALL YOUR PODIATRIST OFFICE TODAY--WE WILL NEED FOR THAT MD PERFORMING YOUR TOE PROCEDURE TO FAX OUR OFFICE A MEDICAL/CARDIAC CLEARANCE FORM INDICATING WHAT PROCEDURE YOU WILL HAVE DONE, WHEN THIS WILL BE DONE, AND THEIR CONTACT INFORMATION.  HAVE THEM SEND THIS TO OUR OFFICE AT [phone] ATTN: DR. Virgilio Belling WAY WE WILL BE ABLE TO DOCUMENT ON THE FORM THAT YOU ARE CLEARED FROM A CARDIAC PERSPECTIVE TO HAVE YOUR TOE PROCEDURE DONE.

## 2021-01-17 NOTE — Progress Notes (Signed)
Cardiology Office Note:    Date:  01/17/2021   ID:  Curtis Warren, DOB [birth year], MRN [id number]  PCP:  Carol Ada, MD   [facility] Providers Cardiologist:  Ena Dawley, MD (Inactive) {  Referring MD: Carol Ada, MD    History of Present Illness:    Curtis Warren is a 65 y.o. male with a hx of HTN and borderline ascending aortic aneurysm who was previously followed by Dr. Meda Coffee who now returns to clinic for follow-up.  Per review of the record, he had a prior low risk Myoview in 2017. TTE in 2017 showed an EF of 65 to 70% with grade 1 diastolic dysfunction. He was put on low dose Lasix then for an elevated LVEDP. His last CTA chest on 11/20/20 demonstrated a dilated ascending 4.0cm. Was recommended for annual follow-up.  Today, he is doing reasonably well. He is planned for ingrown toenail removal and has come to Cardiology clinic for pre-operative evaluation. Overall, he is very active with no exertional symptoms. He plays golf and is able to walk the course without chest pain, SOB, claudication, lightheadedness or dizziness. When gets up in the morning he does have pain in his legs but his symptoms improve throughout the day with ambulation. No pain in the feet with ambulation or LE wounds. No orthopnea, PND, LE edema. Blood pressure is well controlled on lisinopril 5mg  daily. He has no lightheadedness, pre-syncopal, syncopal, or peripheral edema, or headaches.   Past Medical History:  Diagnosis Date  . Back pain   . BPH (benign prostatic hyperplasia)   . 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 Dilatation of thoracic aorta ([place])   . Essential hypertension   . Obesity   . Pre-diabetes     Past Surgical History:  Procedure Laterality Date  . COLONOSCOPY    . HEMORRHOID SURGERY    . POLYPECTOMY      Current Medications: Current Meds  Medication Sig  . aspirin EC 81 MG tablet Take 81 mg by mouth daily.  . cetirizine (ZYRTEC) 10 MG tablet  Take 10 mg by mouth as needed.   . finasteride (PROSCAR) 5 MG tablet Take 5 mg by mouth daily.  Marland Kitchen HYDROcodone-acetaminophen (NORCO) 10-325 MG tablet as needed.  Marland Kitchen lisinopril (ZESTRIL) 10 MG tablet Take 5 mg by mouth daily.  . Tamsulosin HCl (FLOMAX) 0.4 MG CAPS Take 0.4 mg by mouth daily. Reported on 12/08/2015     Allergies:   Carvedilol   Social History   Socioeconomic History  . Marital status: Married    Spouse name: Not on file  . Number of children: Not on file  . Years of education: Not on file  . Highest education level: Not on file  Occupational History  . Not on file  Tobacco Use  . Smoking status: Never Smoker  . Smokeless tobacco: Never Used  Substance and Sexual Activity  . Alcohol use: No  . Drug use: No  . Sexual activity: Not on file  Other Topics Concern  . Not on file  Social History Narrative  . Not on file   Social Determinants of Health   Financial Resource Strain: Not on file  Food Insecurity: Not on file  Transportation Needs: Not on file  Physical Activity: Not on file  Stress: Not on file  Social Connections: Not on file     Family History: The patient'sfamily history includes Alcoholism in his mother; Bone cancer in his father; Heart attack (age of onset:  23) in his father. There is no history of Colon cancer.  ROS:   Review of Systems  Constitutional: Negative for chills, diaphoresis and fever.  HENT: Negative for congestion, sinus pain and sore throat.   Respiratory: Negative for cough and shortness of breath.   Cardiovascular: Negative for chest pain, palpitations, orthopnea, leg swelling and PND.  Gastrointestinal: Negative for abdominal pain, blood in stool, constipation and nausea.  Genitourinary: Negative for dysuria, hematuria and urgency.  Musculoskeletal: Negative for back pain.  Neurological: Negative for dizziness and headaches.     EKGs/Labs/Other Studies Reviewed:    The following studies were reviewed today: TTE  12/12/17: Study Conclusions   - Left ventricle: The cavity size was normal. Systolic function was  normal. The estimated ejection fraction was in the range of 60%  to 65%. Wall motion was normal; there were no regional wall  motion abnormalities. Features are consistent with a pseudonormal  left ventricular filling pattern, with concomitant abnormal  relaxation and increased filling pressure (grade 2 diastolic  dysfunction).  - Aorta: Aortic root dimension: 39 mm (ED). Ascending aortic  diameter: 40 mm (S).  - Aortic root: The aortic root was mildly dilated.  - Ascending aorta: The ascending aorta was mildly dilated.  - Mitral valve: There was trivial regurgitation.  - Left atrium: The atrium was mildly dilated.  - Right ventricle: The cavity size was mildly dilated. Wall  thickness was normal.  - Tricuspid valve: There was mild regurgitation.  CTA 11/20/20: IMPRESSION: 1. Dilated 4.0 cm diameter ascending thoracic aorta, previously 3.9 cm, stable to minimally increased. Recommend annual imaging followup by CTA or MRA. This recommendation follows 2010 ACCF/AHA/AATS/ACR/ASA/SCA/SCAI/SIR/STS/SVM Guidelines for the Diagnosis and Management of Patients with Thoracic Aortic Disease. Circulation. 2010; 121: T700-F749. Aortic aneurysm NOS (ICD10-I71.9). 2. No active pulmonary disease.   EKG:   5/18-Normal sinus rhythm, rate: 60   Recent Labs: 09/26/2020: Hemoglobin 16.1; Platelets 178 11/10/2020: BUN 11; Creatinine, Ser 1.17; Potassium 4.0; Sodium 142  Recent Lipid Panel    Component Value Date/Time   CHOL 157 11/08/2019 0751   TRIG 119 11/08/2019 0751   HDL 36 (L) 11/08/2019 0751   CHOLHDL 4.4 11/08/2019 0751   CHOLHDL 4.6 12/09/2015 0502   VLDL 14 12/09/2015 0502   LDLCALC 99 11/08/2019 0751      Physical Exam:    VS:  BP 124/78   Pulse 60   Ht 5\' 11"  (1.803 m)   Wt 240 lb 9.6 oz (109.1 kg)   SpO2 95%   BMI 33.56 kg/m     Wt Readings from Last 3  Encounters:  01/17/21 240 lb 9.6 oz (109.1 kg)  09/28/20 238 lb (108 kg)  09/26/20 238 lb (108 kg)     GEN: Well nourished, well developed in no acute distress HEENT: Normal NECK: No JVD; No carotid bruits LYMPHATICS: No lymphadenopathy CARDIAC: RRR, no murmurs, rubs, gallops, +2 pulse  RESPIRATORY:  Clear to auscultation without rales, wheezing or rhonchi  ABDOMEN: Soft, non-tender, non-distended MUSCULOSKELETAL:  No edema; No deformity. 1-2+ DP/PT pulses. LE warm SKIN: Warm and dry NEUROLOGIC:  Alert and oriented x 3 PSYCHIATRIC:  Normal affect   ASSESSMENT:    1. Pre-operative clearance   2. Ascending aortic aneurysm ([place])   3. Essential hypertension    PLAN:    In order of problems listed above:  #Pre-operative evaluation prior to ingrown toenail removal: Patient is active with no anginal or heart failure symptoms. Able to perform >4METs without issues with  no indication for further CV work-up at this time. ABIs per report mildly abnormal but patient has no symptoms of claudication and palpable pulses in his feet. Will continue to monitor at this time. -No further CV testing indicated  #Mildly dilated ascending aorta: Measured 4.0cm on last CTA. Will need annual montoring. -Next CTA/MRA aorta needed 10/2021 -Continue ASA 81mg  daily -Continue lisinopril 5mg  daily  #HTN: Very well controlled at home. -Continue lisinopril 5mg  daily  Medication Adjustments/Labs and Tests Ordered: Current medicines are reviewed at length with the patient today.  Concerns regarding medicines are outlined above.  Orders Placed This Encounter  Procedures  . EKG 12-Lead   No orders of the defined types were placed in this encounter.   Patient Instructions  Medication Instructions:   Your physician recommends that you continue on your current medications as directed. Please refer to the Current Medication list given to you today.  *If you need a refill on your cardiac medications  before your next appointment, please call your pharmacy*   Follow-Up: At [facility], you and your health needs are our priority.  As part of our continuing mission to provide you with exceptional heart care, we have created designated Provider Care Teams.  These Care Teams include your primary Cardiologist (physician) and Advanced Practice Providers (APPs -  Physician Assistants and Nurse Practitioners) who all work together to provide you with the care you need, when you need it.  We recommend signing up for the patient portal called "MyChart".  Sign up information is provided on this After Visit Summary.  MyChart is used to connect with patients for Virtual Visits (Telemedicine).  Patients are able to view lab/test results, encounter notes, upcoming appointments, etc.  Non-urgent messages can be sent to your provider as well.   To learn more about what you can do with MyChart, go to [email or web address].    Your next appointment:   1 year(s)  The format for your next appointment:   In Person  Provider:   Gwyndolyn Kaufman, MD   Other Instructions  PLEASE CALL YOUR PODIATRIST OFFICE TODAY--WE WILL NEED FOR THAT MD PERFORMING YOUR TOE PROCEDURE TO FAX OUR OFFICE A MEDICAL/CARDIAC CLEARANCE FORM INDICATING WHAT PROCEDURE YOU WILL HAVE DONE, WHEN THIS WILL BE DONE, AND THEIR CONTACT INFORMATION.  HAVE THEM SEND THIS TO OUR OFFICE AT [phone] ATTN: DR. Virgilio Belling WAY WE WILL BE ABLE TO DOCUMENT ON THE FORM THAT YOU ARE CLEARED FROM A CARDIAC PERSPECTIVE TO HAVE YOUR TOE PROCEDURE DONE.        I,Curtis Warren,acting as a Education administrator for Curtis Bergeron, MD.,have documented all relevant documentation on the behalf of Curtis Bergeron, MD,as directed by  Curtis Bergeron, MD while in the presence of Curtis Bergeron, MD.  I, Curtis Bergeron, MD, have reviewed all documentation for this visit. The documentation on 01/17/21 for the exam, diagnosis, procedures, and  orders are all accurate and complete. Signed, Curtis Bergeron, MD  01/17/2021 10:28 AM    [facility]

## 2021-01-18 DIAGNOSIS — I739 Peripheral vascular disease, unspecified: Secondary | ICD-10-CM | POA: Diagnosis not present

## 2021-01-18 DIAGNOSIS — L03031 Cellulitis of right toe: Secondary | ICD-10-CM | POA: Diagnosis not present

## 2021-01-26 ENCOUNTER — Telehealth: Payer: Self-pay | Admitting: *Deleted

## 2021-01-26 NOTE — Telephone Encounter (Signed)
   [place] Pre-operative Risk Assessment    Patient Name: TRICE ASPINALL  DOB: October 23, 1955  MRN: [id number]   HEARTCARE STAFF: - Please ensure there is not already an duplicate clearance open for this procedure. - Under Visit Info/Reason for Call, type in Other and utilize the format Clearance MM/DD/YY or Clearance TBD. Do not use dashes or single digits. - If request is for dental extraction, please clarify the # of teeth to be extracted.  Request for surgical clearance:  1. What type of surgery is being performed?  RT UPPER EYELID PTOSIS REPAIR   2. When is this surgery scheduled?  TBD   3. What type of clearance is required (medical clearance vs. Pharmacy clearance to hold med vs. Both)?  BOTH  4. Are there any medications that need to be held prior to surgery and how long? ASPIRIN   5. Practice name and name of physician performing surgery?  [facility] / DR. Carvel Getting    6. What is the office phone number?  [phone]   7.   What is the office fax number?  [phone]  8.   Anesthesia type (None, local, MAC, general) ?     Jeanann Lewandowsky 01/26/2021, 11:36 AM  _________________________________________________________________   (provider comments below)

## 2021-01-26 NOTE — Telephone Encounter (Signed)
   Name: Curtis Warren  DOB: 15-Jul-1956  MRN: [id number]   Primary Cardiologist: Ena Dawley, MD (Inactive)  Chart reviewed as part of pre-operative protocol coverage. Patient was contacted 01/26/2021 in reference to pre-operative risk assessment for pending surgery as outlined below.  Curtis Warren was last seen on 01/17/2021 by Dr. Johney Frame.  Since that day, Curtis Warren has done well and is without symptoms of angina or decompensation.  Therefore, based on ACC/AHA guidelines, the patient would be at acceptable risk for the planned procedure without further cardiovascular testing.   He may hold aspirin 5 days prior to procedure. Resumption of aspirin is deferred to the treating provider.   The patient was advised that if he develops new symptoms prior to surgery to contact our office to arrange for a follow-up visit, and he verbalized understanding.  I will route this recommendation to the requesting party via Epic fax function and remove from pre-op pool. Please call with questions.  Christell Faith, PA-C 01/26/2021, 12:25 PM

## 2021-02-14 DIAGNOSIS — G4733 Obstructive sleep apnea (adult) (pediatric): Secondary | ICD-10-CM | POA: Diagnosis not present

## 2021-02-26 DIAGNOSIS — H401111 Primary open-angle glaucoma, right eye, mild stage: Secondary | ICD-10-CM | POA: Diagnosis not present

## 2021-03-18 ENCOUNTER — Other Ambulatory Visit: Payer: Self-pay

## 2021-03-18 ENCOUNTER — Emergency Department (HOSPITAL_COMMUNITY): Payer: Medicare HMO

## 2021-03-18 ENCOUNTER — Emergency Department (HOSPITAL_COMMUNITY)
Admission: EM | Admit: 2021-03-18 | Discharge: 2021-03-18 | Disposition: A | Discharge status: Home / Self Care | Payer: Medicare HMO | Attending: Emergency Medicine | Admitting: Emergency Medicine

## 2021-03-18 ENCOUNTER — Encounter (HOSPITAL_COMMUNITY): Payer: Self-pay | Admitting: Emergency Medicine

## 2021-03-18 DIAGNOSIS — N23 Unspecified renal colic: Secondary | ICD-10-CM

## 2021-03-18 DIAGNOSIS — I10 Essential (primary) hypertension: Secondary | ICD-10-CM | POA: Diagnosis not present

## 2021-03-18 DIAGNOSIS — R1114 Bilious vomiting: Secondary | ICD-10-CM | POA: Diagnosis not present

## 2021-03-18 DIAGNOSIS — Z79899 Other long term (current) drug therapy: Secondary | ICD-10-CM | POA: Diagnosis not present

## 2021-03-18 DIAGNOSIS — R079 Chest pain, unspecified: Secondary | ICD-10-CM | POA: Diagnosis not present

## 2021-03-18 DIAGNOSIS — R109 Unspecified abdominal pain: Secondary | ICD-10-CM | POA: Diagnosis not present

## 2021-03-18 DIAGNOSIS — R0789 Other chest pain: Secondary | ICD-10-CM | POA: Diagnosis not present

## 2021-03-18 DIAGNOSIS — R112 Nausea with vomiting, unspecified: Secondary | ICD-10-CM | POA: Diagnosis not present

## 2021-03-18 DIAGNOSIS — R61 Generalized hyperhidrosis: Secondary | ICD-10-CM | POA: Insufficient documentation

## 2021-03-18 DIAGNOSIS — Z7982 Long term (current) use of aspirin: Secondary | ICD-10-CM | POA: Insufficient documentation

## 2021-03-18 DIAGNOSIS — K760 Fatty (change of) liver, not elsewhere classified: Secondary | ICD-10-CM | POA: Diagnosis not present

## 2021-03-18 DIAGNOSIS — N281 Cyst of kidney, acquired: Secondary | ICD-10-CM | POA: Diagnosis not present

## 2021-03-18 DIAGNOSIS — I700 Atherosclerosis of aorta: Secondary | ICD-10-CM | POA: Diagnosis not present

## 2021-03-18 DIAGNOSIS — N132 Hydronephrosis with renal and ureteral calculous obstruction: Secondary | ICD-10-CM | POA: Diagnosis not present

## 2021-03-18 LAB — CBC
HCT: 45.2 % (ref 39.0–52.0)
Hemoglobin: 15.6 g/dL (ref 13.0–17.0)
MCH: 31.1 pg (ref 26.0–34.0)
MCHC: 34.5 g/dL (ref 30.0–36.0)
MCV: 90 fL (ref 80.0–100.0)
Platelets: 183 10*3/uL (ref 150–400)
RBC: 5.02 MIL/uL (ref 4.22–5.81)
RDW: 12.5 % (ref 11.5–15.5)
WBC: 12.9 10*3/uL — ABNORMAL HIGH (ref 4.0–10.5)
nRBC: 0 % (ref 0.0–0.2)

## 2021-03-18 LAB — BASIC METABOLIC PANEL
Anion gap: 10 (ref 5–15)
BUN: 15 mg/dL (ref 8–23)
CO2: 22 mmol/L (ref 22–32)
Calcium: 9.5 mg/dL (ref 8.9–10.3)
Chloride: 107 mmol/L (ref 98–111)
Creatinine, Ser: 1.3 mg/dL — ABNORMAL HIGH (ref 0.61–1.24)
GFR, Estimated: >60 mL/min (ref >60)
Glucose, Bld: 147 mg/dL — ABNORMAL HIGH (ref 70–99)
Potassium: 4 mmol/L (ref 3.5–5.1)
Sodium: 139 mmol/L (ref 135–145)

## 2021-03-18 LAB — URINALYSIS, ROUTINE W REFLEX MICROSCOPIC
Bilirubin Urine: NEGATIVE
Glucose, UA: NEGATIVE mg/dL
Hgb urine dipstick: NEGATIVE
Ketones, ur: NEGATIVE mg/dL
Leukocytes,Ua: NEGATIVE
Nitrite: NEGATIVE
Protein, ur: NEGATIVE mg/dL
Specific Gravity, Urine: 1.018 (ref 1.005–1.030)
pH: 5 (ref 5.0–8.0)

## 2021-03-18 LAB — TROPONIN I (HIGH SENSITIVITY)
Troponin I (High Sensitivity): 6 ng/L (ref <18)
Troponin I (High Sensitivity): 4 ng/L (ref <18)

## 2021-03-18 MED ORDER — OXYCODONE-ACETAMINOPHEN 5-325 MG PO TABS
2.0000 | ORAL_TABLET | Freq: Once | ORAL | Status: AC
  Administered 2021-03-18: 2 via ORAL
  Filled 2021-03-18: qty 2

## 2021-03-18 MED ORDER — ONDANSETRON HCL 4 MG/2ML IJ SOLN
4.0000 mg | Freq: Once | INTRAMUSCULAR | Status: AC
  Administered 2021-03-18: 4 mg via INTRAVENOUS
  Filled 2021-03-18: qty 2

## 2021-03-18 MED ORDER — ONDANSETRON 4 MG PO TBDP: ORAL_TABLET | ORAL | 0 refills | Status: DC

## 2021-03-18 MED ORDER — MORPHINE SULFATE (PF) 4 MG/ML IV SOLN
4.0000 mg | Freq: Once | INTRAVENOUS | Status: AC
  Administered 2021-03-18: 4 mg via INTRAVENOUS
  Filled 2021-03-18: qty 1

## 2021-03-18 MED ORDER — KETOROLAC TROMETHAMINE 30 MG/ML IJ SOLN
15.0000 mg | Freq: Once | INTRAMUSCULAR | Status: AC
  Administered 2021-03-18: 15 mg via INTRAVENOUS
  Filled 2021-03-18: qty 1

## 2021-03-18 MED ORDER — SODIUM CHLORIDE 0.9 % IV BOLUS
1000.0000 mL | Freq: Once | INTRAVENOUS | Status: AC
  Administered 2021-03-18: 1000 mL via INTRAVENOUS

## 2021-03-18 MED ORDER — OXYCODONE-ACETAMINOPHEN 5-325 MG PO TABS: 1.0000 | ORAL_TABLET | Freq: Four times a day (QID) | ORAL | 0 refills | Status: DC | PRN

## 2021-03-18 NOTE — ED Notes (Addendum)
While pt was in [place], pt did not complain about pain to this NT or other EMT in [place]. Pt's visitor came into [place] to see pt, visitor understood about the policy with no visitor in [place]. Visitor assisted pt to floor and came to this NT demanding a room/stretcher for pt. I notified visitor based on pt's acuity, pt will be going back as soon as the next bed becomes available. Pt begins to cuss other staff out while this NT gets recliner for pt, this NT notified visitor to respect staff and other pt's in the [place]. This NT notified security officer Ron to assist staff with visitor, visitor went outside to wait until pt goes back to a room. This NT notified visitor we will call when pt is back in a room.

## 2021-03-18 NOTE — ED Notes (Signed)
Report given to Miguel Dibble, RN

## 2021-03-18 NOTE — ED Notes (Signed)
meds have not been verified by pharmacy

## 2021-03-18 NOTE — ED Notes (Signed)
Flank pain since this am  No bloody urine no urinary frequency

## 2021-03-18 NOTE — ED Provider Notes (Signed)
  Physical Exam  BP (!) 154/93   Pulse 61   Temp 97.7 F (36.5 C)   Resp 15   Ht 5\' 11"  (1.803 m)   Wt 106.6 kg   SpO2 99%   BMI 32.78 kg/m   Physical Exam  ED Course/Procedures     Procedures  MDM  Patient care assumed at 3 PM.  Patient is here with flank pain.  Signout pending CT renal stone and urinalysis  7:16 PM CT showed 4 mm stone with some hydro-.  Urinalysis did not show any UTI.  Patient's pain under control.  Patient is already on Flomax.  Will refer to urology and prescribe Percocet for pain      Drenda Freeze, MD 03/18/21 [phone]

## 2021-03-18 NOTE — ED Triage Notes (Signed)
Patient from home complaint of sudden onset right flank pain and chest pain that started at 1000 this morning while at church.

## 2021-03-18 NOTE — ED Provider Notes (Signed)
[facility] EMERGENCY DEPARTMENT Provider Note   CSN: [id number] Arrival date & time: 03/18/21  1205     History Chief Complaint  Patient presents with   Chest Pain   Flank Pain    Curtis Warren is a 65 y.o. male.  HPI Presents due to acute onset right flank pain.  Pain began suddenly about 5 hours ago, since that time has been severe, persistent in the right flank, right lateral abdomen, radiating inferiorly towards the inguinal crease. Patient specifically denies chest pain to me, though this was a concern on the nursing notes.  He states that he has a concern about a known ascending thoracic aortic aneurysm.  He denies chest pain.  However, since pain began and has been severe he is worried about implications for this. There is associated nausea, vomiting, no relief with Tylenol.  No fever.  No obvious urinary changes.  No history of nephrolithiasis, nor renal complications. He is joined by his wife at bedside.    Past Medical History:  Diagnosis Date   Back pain    BPH (benign prostatic hyperplasia)    Diastolic dysfunction without heart failure    Dilatation of thoracic aorta ([facility])    Essential hypertension    Obesity    Pre-diabetes     Patient Active Problem List   Diagnosis Date Noted   Hypotension 09/28/2020   Renal insufficiency 09/28/2020   Pre-operative clearance 09/26/2020   Chest pain 12/08/2015   Ascending aortic aneurysm ([place]) 12/08/2015   OBSTRUCTIVE SLEEP APNEA 08/20/2007    Past Surgical History:  Procedure Laterality Date   COLONOSCOPY     HEMORRHOID SURGERY     POLYPECTOMY         Family History  Problem Relation Age of Onset   Alcoholism Mother    Bone cancer Father    Heart attack Father 14   Colon cancer Neg Hx     Social History   Tobacco Use   Smoking status: Never   Smokeless tobacco: Never  Substance Use Topics   Alcohol use: No   Drug use: No    Home Medications Prior to Admission medications    Medication Sig Start Date End Date Taking? Authorizing Provider  aspirin EC 81 MG tablet Take 81 mg by mouth daily.    [provider]  cetirizine (ZYRTEC) 10 MG tablet Take 10 mg by mouth as needed.     [provider]  finasteride (PROSCAR) 5 MG tablet Take 5 mg by mouth daily.    [provider]  HYDROcodone-acetaminophen (NORCO) 10-325 MG tablet as needed. 06/30/20   [provider]  lisinopril (ZESTRIL) 10 MG tablet Take 5 mg by mouth daily.    [provider]  Tamsulosin HCl (FLOMAX) 0.4 MG CAPS Take 0.4 mg by mouth daily. Reported on 12/08/2015    [provider]    Allergies    Carvedilol  Review of Systems   Review of Systems  Constitutional:        Per HPI, otherwise negative  HENT:         Per HPI, otherwise negative  Respiratory:         Per HPI, otherwise negative  Cardiovascular:        Per HPI, otherwise negative  Gastrointestinal:  Positive for abdominal pain, nausea and vomiting.  Endocrine:       Negative aside from HPI  Genitourinary:        Neg aside from HPI  Musculoskeletal:        Per HPI, otherwise negative  Skin: Negative.   Neurological:  Negative for syncope.   Physical Exam Updated Vital Signs BP (!) 146/82   Pulse (!) 56   Temp 97.7 F (36.5 C)   Resp 13   Ht 5\' 11"  (1.803 m)   Wt 106.6 kg   SpO2 95%   BMI 32.78 kg/m   Physical Exam Vitals and nursing note reviewed.  Constitutional:      Appearance: He is well-developed. He is diaphoretic.  HENT:     Head: Normocephalic and atraumatic.  Eyes:     Conjunctiva/sclera: Conjunctivae normal.  Cardiovascular:     Rate and Rhythm: Normal rate and regular rhythm.  Pulmonary:     Effort: Pulmonary effort is normal. No respiratory distress.     Breath sounds: No stridor.  Abdominal:     General: There is no distension.     Palpations: Abdomen is soft.     Comments: No guarding, tenderness, patient indicates right lateral anterior  abdomen is the area of deeper pain.  Skin:    General: Skin is warm.  Neurological:     Mental Status: He is alert and oriented to person, place, and time.    ED Results / Procedures / Treatments   Labs (all labs ordered are listed, but only abnormal results are displayed) Labs Reviewed  BASIC METABOLIC PANEL - Abnormal; Notable for the following components:      Result Value   Glucose, Bld 147 (*)    Creatinine, Ser 1.30 (*)    All other components within normal limits  CBC - Abnormal; Notable for the following components:   WBC 12.9 (*)    All other components within normal limits  URINALYSIS, ROUTINE W REFLEX MICROSCOPIC  TROPONIN I (HIGH SENSITIVITY)  TROPONIN I (HIGH SENSITIVITY)    EKG EKG Interpretation  Date/Time:  Sunday March 18 2021 12:12:17 EDT Ventricular Rate:  64 PR Interval:  142 QRS Duration: 88 QT Interval:  412 QTC Calculation: 425 R Axis:   56 Text Interpretation: Normal sinus rhythm Normal ECG Confirmed by Carmin Muskrat [phone]) on 03/18/2021 1:59:56 PM  Radiology DG Chest 2 View  Result Date: 03/18/2021 CLINICAL DATA:  Chest pain.  Flank pain. EXAM: CHEST - 2 VIEW COMPARISON:  Chest CT 11/20/2020, radiograph 12/08/2015 FINDINGS: The cardiomediastinal contours are normal. The lungs are clear. Pulmonary vasculature is normal. No consolidation, pleural effusion, or pneumothorax. No acute osseous abnormalities are seen. IMPRESSION: No acute chest findings or explanation for pain. Electronically Signed   By: Keith Rake M.D.   On: 03/18/2021 13:47    Procedures Procedures   Medications Ordered in ED Medications  sodium chloride 0.9 % bolus 1,000 mL (1,000 mLs Intravenous New Bag/Given 03/18/21 1528)  ondansetron (ZOFRAN) injection 4 mg (4 mg Intravenous Given 03/18/21 1519)  morphine 4 MG/ML injection 4 mg (4 mg Intravenous Given 03/18/21 1523)  ketorolac (TORADOL) 30 MG/ML injection 15 mg (15 mg Intravenous Given 03/18/21 1521)    ED Course  I  have reviewed the triage vital signs and the nursing notes.  Pertinent labs & imaging results that were available during my care of the patient were reviewed by me and considered in my medical decision making (see chart for details).   3:47 PM Patient much more comfortable MDM Rules/Calculators/A&P Note male awake and alert presents with flank pain.  Patient does not have chest pain, but does perseverate on possible changes to his known  thoracic aortic aneurysm.  Patient's story concerning for stone versus infection, less likely acute GI pathology.  On signout the patient is awaiting CT scan results, initial labs are generally reassuring, notable for slight elevation in creatinine, mild hyperglycemia. Patient received fluids, Toradol, morphine, Zofran, improved substantially.  Dr. Darl Householder aware. Final Clinical Impression(s) / ED Diagnoses Final diagnoses:  Right flank pain  Bilious vomiting with nausea     Carmin Muskrat, MD 03/18/21 [phone]

## 2021-03-18 NOTE — Discharge Instructions (Addendum)
Take motrin for pain   Take percocet for severe pain   Take zofran for nausea   Stay hydrated   See urologist for follow up   Continue taking your flomax   Return to ER if you have worse back pain, flank pain, vomiting

## 2021-03-18 NOTE — ED Notes (Addendum)
Pt's wife requested immediate care for her husband. While using profanity, she demanded answers regarding the update on her husband. Explained to her procedures for visitation. She was escorted outside until room is ready and clean.

## 2021-03-18 NOTE — ED Notes (Signed)
Pain med given to c-t

## 2021-03-20 ENCOUNTER — Encounter (HOSPITAL_COMMUNITY): Payer: Self-pay

## 2021-03-20 ENCOUNTER — Other Ambulatory Visit: Payer: Self-pay

## 2021-03-20 ENCOUNTER — Emergency Department (HOSPITAL_COMMUNITY): Payer: Medicare HMO

## 2021-03-20 ENCOUNTER — Emergency Department (HOSPITAL_COMMUNITY)
Admission: EM | Admit: 2021-03-20 | Discharge: 2021-03-20 | Disposition: A | Discharge status: Home / Self Care | Payer: Medicare HMO | Attending: Emergency Medicine | Admitting: Emergency Medicine

## 2021-03-20 DIAGNOSIS — I10 Essential (primary) hypertension: Secondary | ICD-10-CM | POA: Diagnosis not present

## 2021-03-20 DIAGNOSIS — Z79899 Other long term (current) drug therapy: Secondary | ICD-10-CM | POA: Insufficient documentation

## 2021-03-20 DIAGNOSIS — N200 Calculus of kidney: Secondary | ICD-10-CM | POA: Diagnosis not present

## 2021-03-20 DIAGNOSIS — Z7982 Long term (current) use of aspirin: Secondary | ICD-10-CM | POA: Insufficient documentation

## 2021-03-20 DIAGNOSIS — R109 Unspecified abdominal pain: Secondary | ICD-10-CM | POA: Diagnosis not present

## 2021-03-20 DIAGNOSIS — R52 Pain, unspecified: Secondary | ICD-10-CM

## 2021-03-20 HISTORY — DX: Abdominal aortic aneurysm, without rupture: I71.4

## 2021-03-20 HISTORY — DX: Abdominal aortic aneurysm, without rupture, unspecified: I71.40

## 2021-03-20 LAB — COMPREHENSIVE METABOLIC PANEL
ALT: 34 U/L (ref 0–44)
AST: 35 U/L (ref 15–41)
Albumin: 4.2 g/dL (ref 3.5–5.0)
Alkaline Phosphatase: 80 U/L (ref 38–126)
Anion gap: 9 (ref 5–15)
BUN: 26 mg/dL — ABNORMAL HIGH (ref 8–23)
CO2: 26 mmol/L (ref 22–32)
Calcium: 9 mg/dL (ref 8.9–10.3)
Chloride: 103 mmol/L (ref 98–111)
Creatinine, Ser: 2.1 mg/dL — ABNORMAL HIGH (ref 0.61–1.24)
GFR, Estimated: 34 mL/min — ABNORMAL LOW (ref >60)
Glucose, Bld: 92 mg/dL (ref 70–99)
Potassium: 4.9 mmol/L (ref 3.5–5.1)
Sodium: 138 mmol/L (ref 135–145)
Total Bilirubin: 1.7 mg/dL — ABNORMAL HIGH (ref 0.3–1.2)
Total Protein: 7.3 g/dL (ref 6.5–8.1)

## 2021-03-20 LAB — URINALYSIS, ROUTINE W REFLEX MICROSCOPIC
Bilirubin Urine: NEGATIVE
Glucose, UA: NEGATIVE mg/dL
Hgb urine dipstick: NEGATIVE
Ketones, ur: 20 mg/dL — AB
Leukocytes,Ua: NEGATIVE
Nitrite: NEGATIVE
Protein, ur: NEGATIVE mg/dL
Specific Gravity, Urine: 1.016 (ref 1.005–1.030)
pH: 5 (ref 5.0–8.0)

## 2021-03-20 LAB — CBC WITH DIFFERENTIAL/PLATELET
Abs Immature Granulocytes: 0.06 10*3/uL (ref 0.00–0.07)
Basophils Absolute: 0 10*3/uL (ref 0.0–0.1)
Basophils Relative: 0 %
Eosinophils Absolute: 0 10*3/uL (ref 0.0–0.5)
Eosinophils Relative: 0 %
HCT: 44.6 % (ref 39.0–52.0)
Hemoglobin: 14.6 g/dL (ref 13.0–17.0)
Immature Granulocytes: 1 %
Lymphocytes Relative: 6 %
Lymphs Abs: 0.7 10*3/uL (ref 0.7–4.0)
MCH: 30.6 pg (ref 26.0–34.0)
MCHC: 32.7 g/dL (ref 30.0–36.0)
MCV: 93.5 fL (ref 80.0–100.0)
Monocytes Absolute: 1.2 10*3/uL — ABNORMAL HIGH (ref 0.1–1.0)
Monocytes Relative: 10 %
Neutro Abs: 10.4 10*3/uL — ABNORMAL HIGH (ref 1.7–7.7)
Neutrophils Relative %: 83 %
Platelets: 145 10*3/uL — ABNORMAL LOW (ref 150–400)
RBC: 4.77 MIL/uL (ref 4.22–5.81)
RDW: 12.6 % (ref 11.5–15.5)
WBC: 12.5 10*3/uL — ABNORMAL HIGH (ref 4.0–10.5)
nRBC: 0 % (ref 0.0–0.2)

## 2021-03-20 MED ORDER — SODIUM CHLORIDE 0.9 % IV BOLUS
1000.0000 mL | Freq: Once | INTRAVENOUS | Status: AC
Start: 2021-03-20 — End: 2021-03-20
  Administered 2021-03-20: 1000 mL via INTRAVENOUS

## 2021-03-20 MED ORDER — KETOROLAC TROMETHAMINE 30 MG/ML IJ SOLN
15.0000 mg | Freq: Once | INTRAMUSCULAR | Status: DC
  Filled 2021-03-20: qty 1

## 2021-03-20 NOTE — ED Provider Notes (Signed)
Accepted handoff at shift change from [facility]. Please see prior provider note for more detail.   Briefly: Patient is 65 y.o.  "Curtis Warren is a 65 y.o. male with a past medical history significant for AAA, BPH, diastolic heart failure, hypertension, and prediabetes who presents to the ED due to left flank pain that started earlier today.  Patient was evaluated in the ED on 7/17 and diagnosed with a 4 mm right kidney stone.  Patient states he was told to return to the ED if he developed any left flank pain.  Patient denies fever and chills.  Denies dysuria or hematuria.  No previous kidney stones before 2 days ago. He has been taking Percocet and Flomax with moderate relief.  No overlying rash.  Denies abdominal pain, nausea, vomiting, diarrhea.  No chest pain or shortness of breath.   History obtained from patient and past medical records. No interpreter used during encounter. "   Plan: DC after fluids    Physical Exam  BP 126/81   Pulse 76   Temp 98.7 F (37.1 C) (Oral)   Resp 20   Ht 5\' 11"  (1.803 m)   Wt 105.2 kg   SpO2 97%   BMI 32.34 kg/m   Physical Exam Vitals and nursing note reviewed.  Constitutional:      General: He is not in acute distress. HENT:     Head: Normocephalic and atraumatic.     Nose: Nose normal.  Eyes:     General: No scleral icterus. Cardiovascular:     Rate and Rhythm: Normal rate and regular rhythm.     Pulses: Normal pulses.     Heart sounds: Normal heart sounds.  Pulmonary:     Effort: Pulmonary effort is normal. No respiratory distress.     Breath sounds: No wheezing.  Abdominal:     Palpations: Abdomen is soft.     Tenderness: There is no abdominal tenderness.     Comments: Abdomen soft nontender no guarding or rebound.  No bruising.  Musculoskeletal:     Cervical back: Normal range of motion.     Right lower leg: No edema.     Left lower leg: No edema.  Skin:    General: Skin is warm and dry.     Capillary Refill:  Capillary refill takes less than 2 seconds.  Neurological:     Mental Status: He is alert. Mental status is at baseline.  Psychiatric:        Mood and Affect: Mood normal.        Behavior: Behavior normal.    ED Course/Procedures   Clinical Course as of 03/20/21 2323  Tue Mar 20, 2021  2111 WBC(!): 12.5 [CA]    Clinical Course User Index [CA] Suzy Bouchard, PA-C    Procedures  MDM  Patient reassessed.  Continues to have no symptoms.  Has had reassuring work-up.  Plan was to discharge home I agree with this plan.  Patient is quite excited to be discharged.  He understands strict return precautions however.  Any abdominal pain bruising or any other new or concerning symptoms.       Pati Gallo [place], [place] 03/20/21 2325    Lacretia Leigh, MD 03/21/21 [phone]

## 2021-03-20 NOTE — ED Provider Notes (Signed)
Emergency Medicine Provider Triage Evaluation Note  Curtis Warren , a 65 y.o. male  was evaluated in triage.  Pt complains of left flank/low back pain that started earlier today.  Patient evaluated in the ED on 7/17 due to right flank pain and diagnosed with a 4 mm right kidney stone.  Patient states he was advised to report to the ED if he developed any left flank pain.  No fever or chills.  Denies dysuria.  He has been taking Percocet and Flomax. No rash  Review of Systems  Positive: Flank pain Negative: fever  Physical Exam  BP 118/75 (BP Location: Left Arm)   Pulse 85   Temp 98.7 F (37.1 C) (Oral)   Resp 18   Ht 5\' 11"  (1.803 m)   Wt 105.2 kg   SpO2 100%   BMI 32.34 kg/m  Gen:   Awake, no distress   Resp:  Normal effort  MSK:   Moves extremities without difficulty  Other:    Medical Decision Making  Medically screening exam initiated at 6:07 PM.  Appropriate orders placed.  Annabelle Harman was informed that the remainder of the evaluation will be completed by another provider, this initial triage assessment does not replace that evaluation, and the importance of remaining in the ED until their evaluation is complete.  Labs UA Renal US   Karie Kirks 03/20/21 1809    Arnaldo Natal, MD 03/20/21 2228

## 2021-03-20 NOTE — Discharge Instructions (Addendum)
It was a pleasure taking care of you today.  Your kidney function was slightly worse today. Please have your kidney function recheck in 1 week with your PCP.  Your ultrasound did not show any fluid in your kidney.  I have included the number of the urologist.  Please call tomorrow to schedule an appointment for further evaluation.  Continue your Percocet as needed for pain.  Continue take Flomax daily.  Return to the ER for new or worsening symptoms.

## 2021-03-20 NOTE — ED Triage Notes (Signed)
Patient c/o bilateral flank pain today. Patient was seen 2 days ago at [facility] ED for right flank pain and bilateral kidney stones noted. Patient states he is urinating without difficulty.

## 2021-03-20 NOTE — ED Provider Notes (Signed)
[facility] DEPT Provider Note   CSN: [id number] Arrival date & time: 03/20/21  1638     History No chief complaint on file.   Curtis Warren is a 65 y.o. male with a past medical history significant for AAA, BPH, diastolic heart failure, hypertension, and prediabetes who presents to the ED due to left flank pain that started earlier today.  Patient was evaluated in the ED on 7/17 and diagnosed with a 4 mm right kidney stone.  Patient states he was told to return to the ED if he developed any left flank pain.  Patient denies fever and chills.  Denies dysuria or hematuria.  No previous kidney stones before 2 days ago. He has been taking Percocet and Flomax with moderate relief.  No overlying rash.  Denies abdominal pain, nausea, vomiting, diarrhea.  No chest pain or shortness of breath.  History obtained from patient and past medical records. No interpreter used during encounter.       Past Medical History:  Diagnosis Date   AAA (abdominal aortic aneurysm) (HCC)    Back pain    BPH (benign prostatic hyperplasia)    Diastolic dysfunction without heart failure    Dilatation of thoracic aorta (HCC)    Essential hypertension    Obesity    Pre-diabetes     Patient Active Problem List   Diagnosis Date Noted   Hypotension 09/28/2020   Renal insufficiency 09/28/2020   Pre-operative clearance 09/26/2020   Chest pain 12/08/2015   Ascending aortic aneurysm ([place]) 12/08/2015   OBSTRUCTIVE SLEEP APNEA 08/20/2007    Past Surgical History:  Procedure Laterality Date   COLONOSCOPY     HEMORRHOID SURGERY     POLYPECTOMY         Family History  Problem Relation Age of Onset   Alcoholism Mother    Bone cancer Father    Heart attack Father 30   Colon cancer Neg Hx     Social History   Tobacco Use   Smoking status: Never   Smokeless tobacco: Never  Vaping Use   Vaping Use: Never used  Substance Use Topics   Alcohol use: No   Drug use: No     Home Medications Prior to Admission medications   Medication Sig Start Date End Date Taking? Authorizing Provider  aspirin EC 81 MG tablet Take 81 mg by mouth daily.    [provider]  cetirizine (ZYRTEC) 10 MG tablet Take 10 mg by mouth as needed.     [provider]  finasteride (PROSCAR) 5 MG tablet Take 5 mg by mouth daily.    [provider]  HYDROcodone-acetaminophen (NORCO) 10-325 MG tablet as needed. 06/30/20   [provider]  lisinopril (ZESTRIL) 10 MG tablet Take 5 mg by mouth daily.    [provider]  ondansetron (ZOFRAN ODT) 4 MG disintegrating tablet 4mg  ODT q4 hours prn nausea/vomit 03/18/21   Drenda Freeze, MD  oxyCODONE-acetaminophen (PERCOCET) 5-325 MG tablet Take 1 tablet by mouth every 6 (six) hours as needed. 03/18/21   Drenda Freeze, MD  Tamsulosin HCl (FLOMAX) 0.4 MG CAPS Take 0.4 mg by mouth daily. Reported on 12/08/2015    [provider]    Allergies    Carvedilol  Review of Systems   Review of Systems  Constitutional:  Negative for chills and fever.  Gastrointestinal:  Negative for abdominal pain, diarrhea, nausea and vomiting.  Genitourinary:  Positive for flank pain. Negative for dysuria.  Musculoskeletal:  Positive for back pain.  All other systems reviewed and are negative.  Physical Exam Updated Vital Signs BP (!) 151/85   Pulse 69   Temp 98.7 F (37.1 C) (Oral)   Resp 19   Ht 5\' 11"  (1.803 m)   Wt 105.2 kg   SpO2 95%   BMI 32.34 kg/m   Physical Exam Vitals and nursing note reviewed.  Constitutional:      General: He is not in acute distress.    Appearance: He is not ill-appearing.  HENT:     Head: Normocephalic.  Eyes:     Pupils: Pupils are equal, round, and reactive to light.  Cardiovascular:     Rate and Rhythm: Normal rate and regular rhythm.     Pulses: Normal pulses.     Heart sounds: Normal heart sounds. No murmur heard.   No friction rub. No gallop.   Pulmonary:     Effort: Pulmonary effort is normal.     Breath sounds: Normal breath sounds.  Abdominal:     General: Abdomen is flat. There is no distension.     Palpations: Abdomen is soft.     Tenderness: There is no abdominal tenderness. There is no guarding or rebound.     Comments: Abdomen soft, nondistended, nontender to palpation in all quadrants without guarding or peritoneal signs. No rebound.   Musculoskeletal:        General: Normal range of motion.     Cervical back: Neck supple.  Skin:    General: Skin is warm and dry.     Comments: No overlying rash  Neurological:     General: No focal deficit present.     Mental Status: He is alert.  Psychiatric:        Mood and Affect: Mood normal.        Behavior: Behavior normal.    ED Results / Procedures / Treatments   Labs (all labs ordered are listed, but only abnormal results are displayed) Labs Reviewed  CBC WITH DIFFERENTIAL/PLATELET - Abnormal; Notable for the following components:      Result Value   WBC 12.5 (*)    Platelets 145 (*)    Neutro Abs 10.4 (*)    Monocytes Absolute 1.2 (*)    All other components within normal limits  COMPREHENSIVE METABOLIC PANEL - Abnormal; Notable for the following components:   BUN 26 (*)    Creatinine, Ser 2.10 (*)    Total Bilirubin 1.7 (*)    GFR, Estimated 34 (*)    All other components within normal limits  URINALYSIS, ROUTINE W REFLEX MICROSCOPIC - Abnormal; Notable for the following components:   Ketones, ur 20 (*)    All other components within normal limits    EKG None  Radiology US RENAL  Result Date: 03/20/2021 CLINICAL DATA:  Bilateral flank pain. EXAM: RENAL / URINARY TRACT ULTRASOUND COMPLETE COMPARISON:  Noncontrast CT 2 days ago 03/18/2021 FINDINGS: Right Kidney: Renal measurements: 12.2 x 6.5 x 6.4 cm = volume: 267 mL. Minimal prominence of the right renal collecting system without frank hydronephrosis. There is a 4 mm nonobstructing stone in the lower  kidney. Renal cyst on prior CT is not well seen by ultrasound. Normal parenchymal echogenicity. Left Kidney: Renal measurements: 11.5 x 5.0 x 4.5 cm = volume: 160 mL. No hydronephrosis. Normal parenchymal echogenicity. No focal renal lesion. Punctate stone on CT is not seen by ultrasound. Bladder: Near completely empty. Neither ureteral jet was seen. No obvious  stone in the bladder. Other: Prominent prostate gland. Distal right ureteral stone on CT is not assessed. IMPRESSION: 1. Mild prominence of the right renal collecting system without frank hydronephrosis. Difficult to compare to recent CT due to differences in modality, likely similar. 2. Intrarenal right renal stone. Punctate stone in the left kidney on CT is not seen by ultrasound. Electronically Signed   By: Keith Rake M.D.   On: 03/20/2021 19:27    Procedures Procedures   Medications Ordered in ED Medications  ketorolac (TORADOL) 30 MG/ML injection 15 mg (15 mg Intramuscular Patient Refused/Not Given 03/20/21 2006)  sodium chloride 0.9 % bolus 1,000 mL (has no administration in time range)    ED Course  I have reviewed the triage vital signs and the nursing notes.  Pertinent labs & imaging results that were available during my care of the patient were reviewed by me and considered in my medical decision making (see chart for details).  Clinical Course as of 03/20/21 2218  Tue Mar 20, 2021  2111 WBC(!): 12.5 [CA]    Clinical Course User Index [CA] Suzy Bouchard, PA-C   MDM Rules/Calculators/A&P                          65 year old male presents to the ED due to left flank pain that started earlier today.  Patient diagnosed with a 4 mm right kidney stone on 7/17 and was told to report to the ED if he developed left flank pain.  No fever or chills.  Upon arrival, vitals all within normal limits.  Patient is afebrile, not tachycardic or hypoxic.  Patient nontoxic-appearing.  Physical exam reassuring.  No overlying rash to  suggest shingles.  Abdomen soft, nondistended, nontender. Low suspicion for acute abdomen. CT renal study 2 days ago showed nonobstructing bilateral renal calculi.  Labs and [place] ordered to rule out hydronephrosis.   CBC significant for mild suicide 12.5 which appears to be improved from 2 days ago.  Mild thrombocytopenia at 145.  CMP significant for worsening renal function with creatinine at 2.1 and BUN at 26, which appears to be worsening from 2 days ago.  Renal US personally reviewed which demonstrates: IMPRESSION:  1. Mild prominence of the right renal collecting system without  frank hydronephrosis. Difficult to compare to recent CT due to  differences in modality, likely similar.  2. Intrarenal right renal stone. Punctate stone in the left kidney  on CT is not seen by ultrasound.   Left kidney without hydronephrosis or visible stones. Discussed with Dr. Louis Meckel with urology regarding patient's worsening renal function who recommends IVFs and repeat BMP with PCP within the next week. IVFs given.  Patient handed off to [facility], PA-C pending IVFs.  Final Clinical Impression(s) / ED Diagnoses Final diagnoses:  Pain  Left flank pain    Rx / DC Orders ED Discharge Orders     None        Karie Kirks 03/20/21 2218    Arnaldo Natal, MD 03/20/21 2228

## 2021-03-21 DIAGNOSIS — N201 Calculus of ureter: Secondary | ICD-10-CM | POA: Diagnosis not present

## 2021-03-21 DIAGNOSIS — Z87442 Personal history of urinary calculi: Secondary | ICD-10-CM | POA: Diagnosis not present

## 2021-04-16 DIAGNOSIS — G4733 Obstructive sleep apnea (adult) (pediatric): Secondary | ICD-10-CM | POA: Diagnosis not present

## 2021-04-17 DIAGNOSIS — E7800 Pure hypercholesterolemia, unspecified: Secondary | ICD-10-CM | POA: Diagnosis not present

## 2021-04-17 DIAGNOSIS — N400 Enlarged prostate without lower urinary tract symptoms: Secondary | ICD-10-CM | POA: Diagnosis not present

## 2021-04-17 DIAGNOSIS — I712 Thoracic aortic aneurysm, without rupture: Secondary | ICD-10-CM | POA: Diagnosis not present

## 2021-04-17 DIAGNOSIS — R7303 Prediabetes: Secondary | ICD-10-CM | POA: Diagnosis not present

## 2021-04-17 DIAGNOSIS — Z23 Encounter for immunization: Secondary | ICD-10-CM | POA: Diagnosis not present

## 2021-04-17 DIAGNOSIS — Z1389 Encounter for screening for other disorder: Secondary | ICD-10-CM | POA: Diagnosis not present

## 2021-04-17 DIAGNOSIS — Z0000 Encounter for general adult medical examination without abnormal findings: Secondary | ICD-10-CM | POA: Diagnosis not present

## 2021-04-17 DIAGNOSIS — I10 Essential (primary) hypertension: Secondary | ICD-10-CM | POA: Diagnosis not present

## 2021-04-17 DIAGNOSIS — M549 Dorsalgia, unspecified: Secondary | ICD-10-CM | POA: Diagnosis not present

## 2021-04-17 DIAGNOSIS — G4733 Obstructive sleep apnea (adult) (pediatric): Secondary | ICD-10-CM | POA: Diagnosis not present

## 2021-09-01 DIAGNOSIS — G4733 Obstructive sleep apnea (adult) (pediatric): Secondary | ICD-10-CM | POA: Diagnosis not present

## 2021-10-02 DIAGNOSIS — H401111 Primary open-angle glaucoma, right eye, mild stage: Secondary | ICD-10-CM | POA: Diagnosis not present

## 2021-10-02 IMAGING — CT CT ANGIO CHEST
3 of 8 series · 18 of 46 positions shown · IV contrast (omnipaque)
Comparison: 11/20/2018 chest CT angiogram.

CLINICAL DATA: Follow-up ascending thoracic aortic aneurysm.

EXAM:
CT ANGIOGRAPHY CHEST WITH CONTRAST
TECHNIQUE: Multidetector CT imaging of the chest was performed using the
standard protocol during bolus administration of intravenous
contrast. Multiplanar CT image reconstructions and MIPs were
obtained to evaluate the vascular anatomy.
CONTRAST:  100mL OMNIPAQUE IOHEXOL 350 MG/ML SOLN

[Series 4: aorta 3.0 bf37 2 · axial · 0.79mm/px · z∈[-420,-86]mm · 13 of 131 slices shown]
[im 10/131  lung]
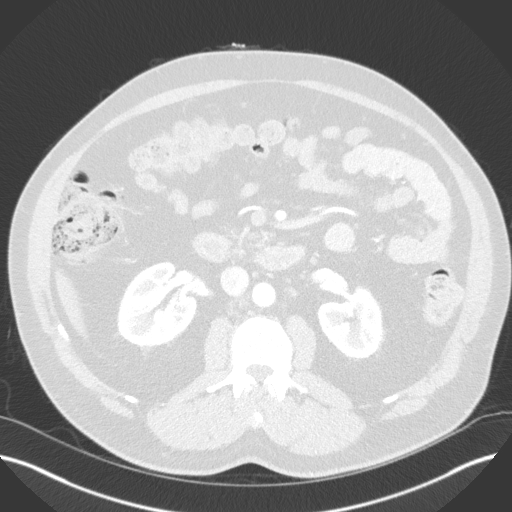
[im 19/131  soft-tissue]
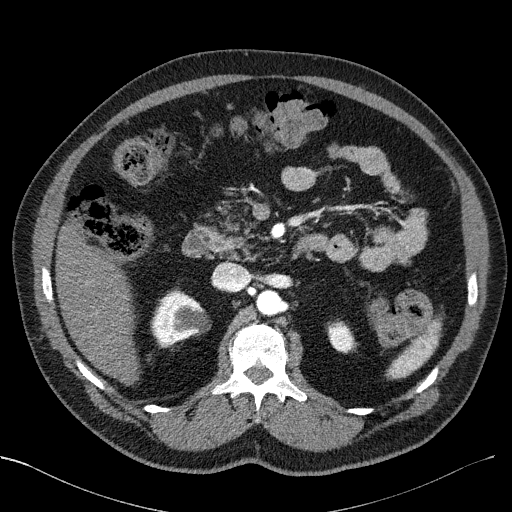
[im 28/131  lung]
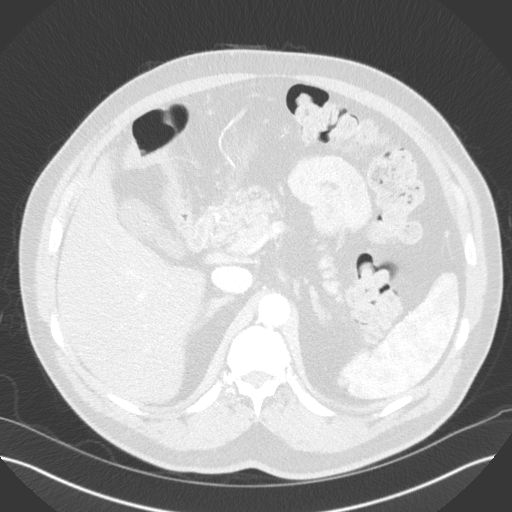
[im 38/131  soft-tissue]
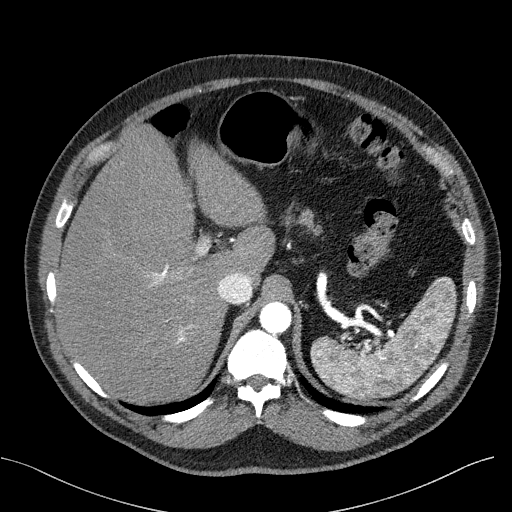
[im 47/131  lung]
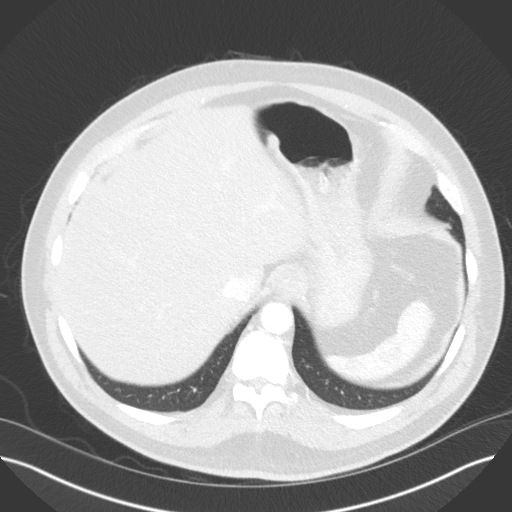
[im 56/131  soft-tissue]
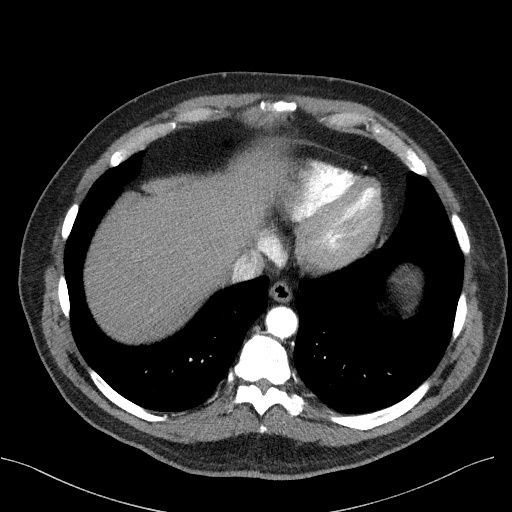
[im 66/131  lung]
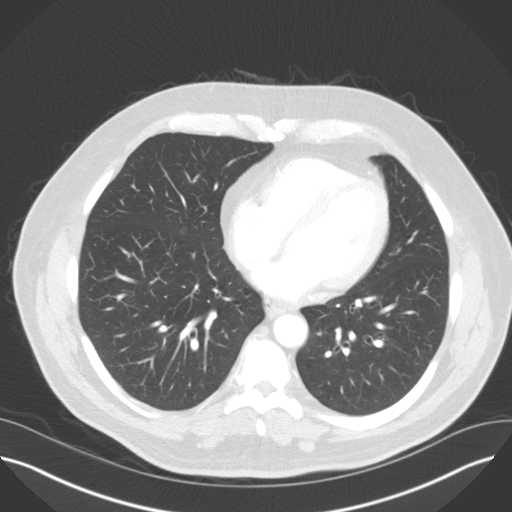
[im 75/131  soft-tissue]
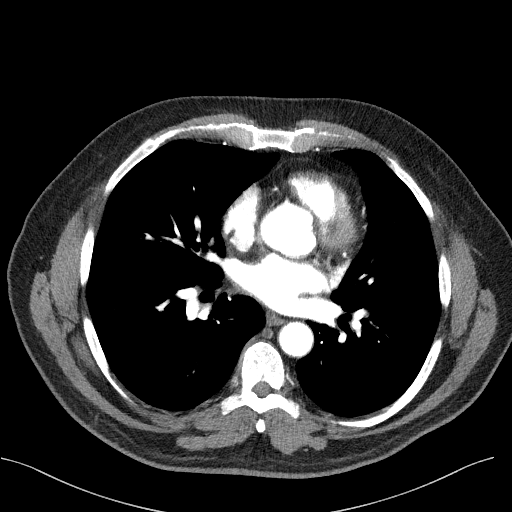
[im 84/131  lung]
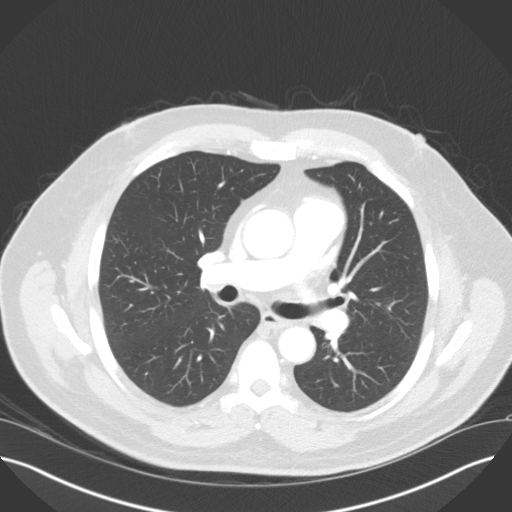
[im 93/131  soft-tissue]
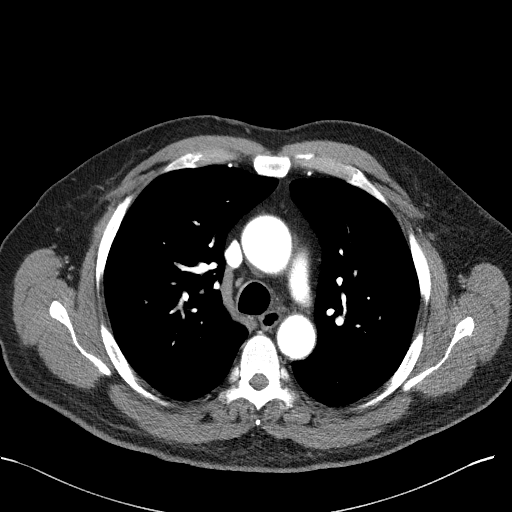
[im 103/131  lung]
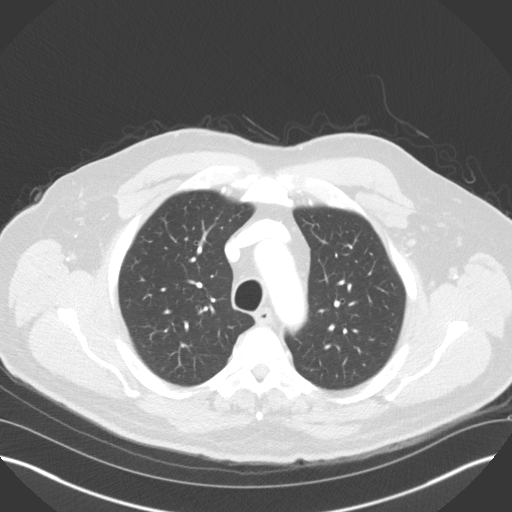
[im 112/131  soft-tissue]
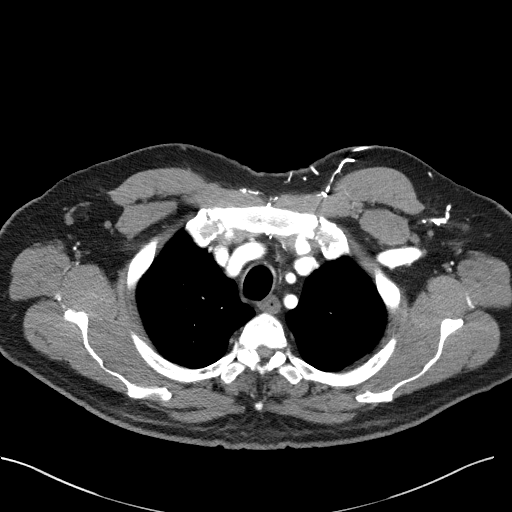
[im 121/131  lung]
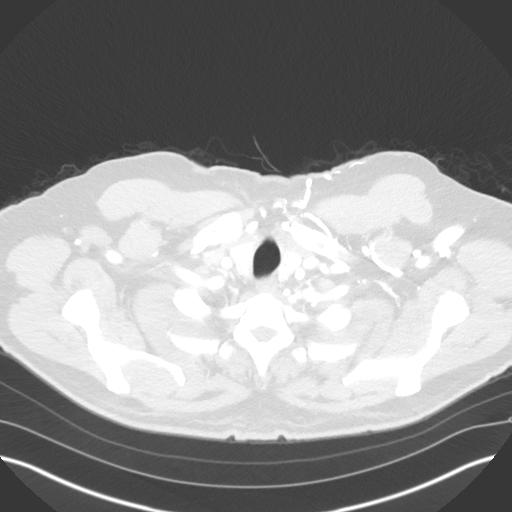

[Series 5: lung · axial · 0.79mm/px · z∈[-420,-366]mm · 2 of 131 slices shown]
[im 10/131  soft-tissue]
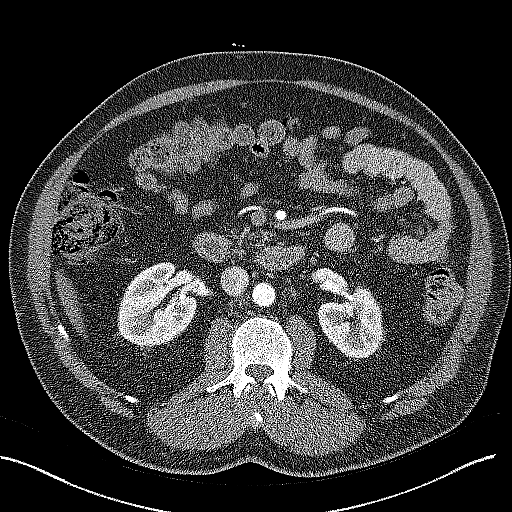
[im 28/131  soft-tissue]
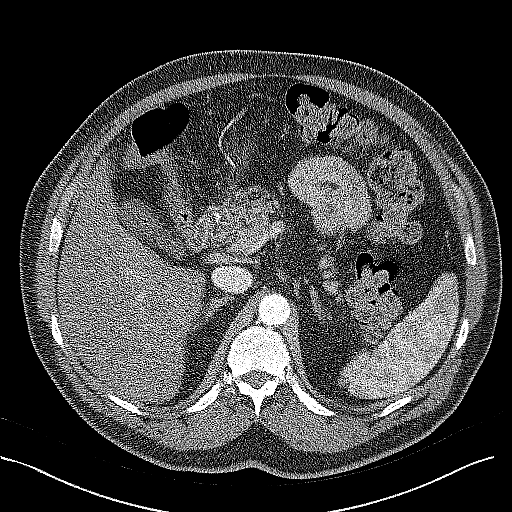

[Series 7: coronals · coronal · 0.77mm/px · 3 of 136 slices shown]
[im 34/136  soft-tissue]
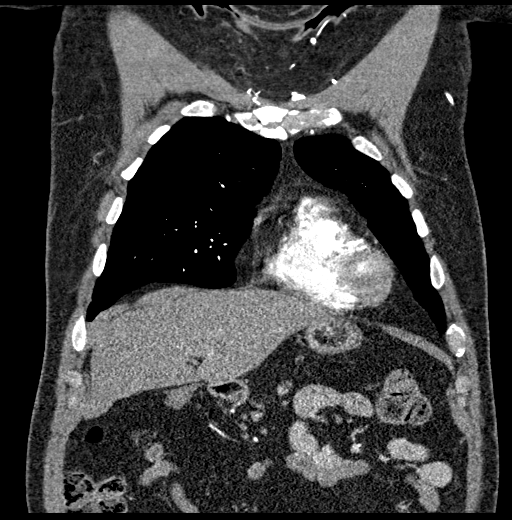
[im 68/136  soft-tissue]
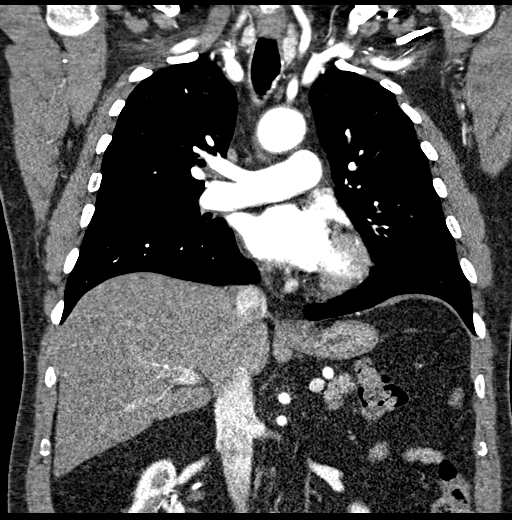
[im 102/136  soft-tissue]
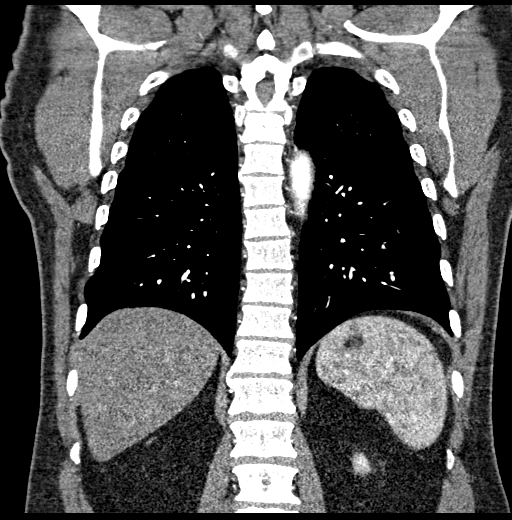

[18 of 46 positions shown; findings below may reference images not displayed]

FINDINGS: Cardiovascular: Normal heart size. No significant pericardial
effusion/thickening. Maximum ascending thoracic aortic diameter
cm, previously 3.9 cm, stable to minimally increased. Aortic root
diameter 4.5 cm at the level of the sinuses of Valsalva, previously
4.6 cm using similar measurement technique, not appreciably changed.
Maximum descending thoracic aortic diameter 3.1 cm, stable. No
aortic dissection or pseudoaneurysm. No penetrating atherosclerotic
ulcer. Top normal caliber main pulmonary artery (3.0 cm diameter),
stable. No central pulmonary emboli.

Mediastinum/Nodes: Subcentimeter hypodense left thyroid nodules are
stable. Not clinically significant; no follow-up imaging recommended
(ref: [HOSPITAL]. [DATE]): 143-50). Unremarkable
esophagus. No pathologically enlarged axillary, mediastinal or hilar
lymph nodes.

Lungs/Pleura: No pneumothorax. No pleural effusion. No acute
consolidative airspace disease, lung masses or significant pulmonary
nodules.

Upper abdomen: Simple 2.3 cm medial upper right renal cysts.

Musculoskeletal: No aggressive appearing focal osseous lesions. Mild
thoracic spondylosis.

Review of the MIP images confirms the above findings.
IMPRESSION: 1. Dilated 4.0 cm diameter ascending thoracic aorta, previously
cm, stable to minimally increased. Recommend annual imaging followup
by CTA or MRA. This recommendation follows 3424
ACCF/AHA/AATS/ACR/ASA/SCA/AWAN/ASARI/YULIE HOSTARA/ASHFAAN Guidelines for the
Diagnosis and Management of Patients with Thoracic Aortic Disease.
Circulation. 3424; 121: [id number]. Aortic aneurysm NOS
([id number]).
2. No active pulmonary disease.

## 2021-10-18 DIAGNOSIS — N400 Enlarged prostate without lower urinary tract symptoms: Secondary | ICD-10-CM | POA: Diagnosis not present

## 2021-10-18 DIAGNOSIS — I10 Essential (primary) hypertension: Secondary | ICD-10-CM | POA: Diagnosis not present

## 2021-10-18 DIAGNOSIS — G4733 Obstructive sleep apnea (adult) (pediatric): Secondary | ICD-10-CM | POA: Diagnosis not present

## 2021-10-18 DIAGNOSIS — G8929 Other chronic pain: Secondary | ICD-10-CM | POA: Diagnosis not present

## 2021-11-14 ENCOUNTER — Other Ambulatory Visit: Payer: Self-pay | Admitting: *Deleted

## 2021-11-14 DIAGNOSIS — I7121 Aneurysm of the ascending aorta, without rupture: Secondary | ICD-10-CM

## 2021-11-15 ENCOUNTER — Telehealth: Payer: Self-pay | Admitting: Cardiology

## 2021-11-15 DIAGNOSIS — G4733 Obstructive sleep apnea (adult) (pediatric): Secondary | ICD-10-CM | POA: Diagnosis not present

## 2021-11-15 NOTE — Telephone Encounter (Signed)
Spoke with the pt.  He was asking if he has to be NPO for upcoming CT ANGIO CHEST AORTA on 3/22.  He states mychart sent him a message indicating for him to be NPO 4 hrs prior to test.  Advised the pt to follow those instructions, as this is probably CT protocol. ?Pt verbalized understanding and agrees with this plan. ?

## 2021-11-15 NOTE — Telephone Encounter (Signed)
Patient was returning call and to ask questions bout the appt on Wednesday. Please advise ?

## 2021-11-19 ENCOUNTER — Other Ambulatory Visit: Payer: Medicare HMO | Admitting: *Deleted

## 2021-11-19 ENCOUNTER — Other Ambulatory Visit: Payer: Self-pay

## 2021-11-19 ENCOUNTER — Other Ambulatory Visit: Payer: Self-pay | Admitting: Cardiology

## 2021-11-19 DIAGNOSIS — I77810 Thoracic aortic ectasia: Secondary | ICD-10-CM

## 2021-11-19 DIAGNOSIS — I7121 Aneurysm of the ascending aorta, without rupture: Secondary | ICD-10-CM

## 2021-11-20 LAB — BASIC METABOLIC PANEL
BUN/Creatinine Ratio: 14 (ref 10–24)
BUN: 17 mg/dL (ref 8–27)
CO2: 22 mmol/L (ref 20–29)
Calcium: 9.8 mg/dL (ref 8.6–10.2)
Chloride: 99 mmol/L (ref 96–106)
Creatinine, Ser: 1.22 mg/dL (ref 0.76–1.27)
Glucose: 102 mg/dL — ABNORMAL HIGH (ref 70–99)
Potassium: 4 mmol/L (ref 3.5–5.2)
Sodium: 142 mmol/L (ref 134–144)
eGFR: 65 mL/min/{1.73_m2} (ref >59)

## 2021-11-21 ENCOUNTER — Ambulatory Visit (INDEPENDENT_AMBULATORY_CARE_PROVIDER_SITE_OTHER)
Admission: RE | Admit: 2021-11-21 | Discharge: 2021-11-21 | Disposition: A | Discharge status: Home / Self Care | Payer: Medicare HMO | Source: Ambulatory Visit | Attending: Cardiology | Admitting: Cardiology

## 2021-11-21 ENCOUNTER — Other Ambulatory Visit: Payer: Self-pay

## 2021-11-21 DIAGNOSIS — I7121 Aneurysm of the ascending aorta, without rupture: Secondary | ICD-10-CM | POA: Diagnosis not present

## 2021-11-21 DIAGNOSIS — I77810 Thoracic aortic ectasia: Secondary | ICD-10-CM

## 2021-11-21 DIAGNOSIS — I700 Atherosclerosis of aorta: Secondary | ICD-10-CM | POA: Diagnosis not present

## 2021-11-21 MED ORDER — IOHEXOL 350 MG/ML SOLN
100.0000 mL | Freq: Once | INTRAVENOUS | Status: AC | PRN
Start: 2021-11-21 — End: 2021-11-21
  Administered 2021-11-21: 100 mL via INTRAVENOUS

## 2021-11-22 ENCOUNTER — Telehealth: Payer: Self-pay | Admitting: *Deleted

## 2021-11-22 DIAGNOSIS — I7121 Aneurysm of the ascending aorta, without rupture: Secondary | ICD-10-CM

## 2021-11-22 DIAGNOSIS — I77810 Thoracic aortic ectasia: Secondary | ICD-10-CM

## 2021-11-22 NOTE — Telephone Encounter (Signed)
The patient has been notified of the result and verbalized understanding.  All questions (if any) were answered. ? ?Pt aware I will go ahead and place the order for repeat CT ANGIO CHEST AORTA in the system to be done in one year, as well as a BMET.  Pt aware CT Scheduler will call him back to arrange this.  ?Pt verbalized understanding and agrees with this plan.  Also went ahead and made the pt his one year follow-up appt with Dr. Johney Frame for 01/07/22 at 10 am.  ?Pt verbalized understanding and agrees with this plan. ? ?

## 2021-11-22 NOTE — Telephone Encounter (Signed)
-----   Message from Freada Bergeron, MD sent at 11/21/2021  8:04 PM EDT ----- ?His CT scan of his aorta looks stable from prior. His aorta remains mildly dilated at 3.9cm. Will continue with annual CT scans. ?

## 2021-11-27 ENCOUNTER — Encounter: Payer: Self-pay | Admitting: Cardiology

## 2021-11-27 MED ORDER — FUROSEMIDE 20 MG PO TABS
20.0000 mg | ORAL_TABLET | Freq: Every day | ORAL | 2 refills | Status: DC
Start: 2021-11-27 — End: 2022-08-20

## 2021-11-27 MED ORDER — LISINOPRIL 5 MG PO TABS: 5.0000 mg | ORAL_TABLET | Freq: Every day | ORAL | 2 refills | Status: AC

## 2021-11-27 NOTE — Addendum Note (Signed)
Addended by: Nuala Alpha on: 11/27/2021 12:12 PM ? ? Modules accepted: Orders ? ?

## 2021-11-27 NOTE — Addendum Note (Signed)
Addended by: Nuala Alpha on: 11/27/2021 10:30 AM ? ? Modules accepted: Orders ? ?

## 2021-11-27 NOTE — Telephone Encounter (Signed)
Curtis Bergeron, MD ?to Me   ?HP ?  10:14 AM ?Totally fine to refill  ? ? ? ?Pt aware both his lisinopril 5 mg po daily and lasix 20 mg po daily refills were sent into his pharmacy on file.  Pt aware via this mychart message.  ?

## 2021-12-26 DIAGNOSIS — H401111 Primary open-angle glaucoma, right eye, mild stage: Secondary | ICD-10-CM | POA: Diagnosis not present

## 2022-01-03 NOTE — Progress Notes (Deleted)
?Cardiology Office Note:   ? ?Date:  01/03/2022  ? ?ID:  Curtis Warren, DOB [birth year], MRN [id number] ? ?PCP:  Carol Ada, MD ?  ?[facility] Providers ?Cardiologist:  Ena Dawley, MD { ? ?Referring MD: Carol Ada, MD  ? ? ?History of Present Illness:   ? ?Curtis Warren is a 66 y.o. male with a hx of HTN and borderline ascending aortic aneurysm who was previously followed by Dr. Meda Coffee who now returns to clinic for follow-up. ? ?Per review of the record, he had a prior low risk Myoview in 2017. TTE in 2017 showed an EF of 65 to 70% with grade 1 diastolic dysfunction. He was put on low dose Lasix then for an elevated LVEDP. His last CTA chest on 11/20/20 demonstrated a dilated ascending 4.0cm. Was recommended for annual follow-up. ? ?Was last seen in clinic on 12/2020 for preop visit prior to toenail removal. He was active with no exertional symptoms at that time and no further testing was needed. ? ?Had CTA for monitoring dilated aorta on 10/2021 which showed ascending aorta measuring 3.9cm. ? ?Today, *** ? ?Past Medical History:  ?Diagnosis Date  ? AAA (abdominal aortic aneurysm)   ? Back pain   ? BPH (benign prostatic hyperplasia)   ? Diastolic dysfunction without heart failure   ? Dilatation of thoracic aorta (HCC)   ? Essential hypertension   ? Obesity   ? Pre-diabetes   ? ? ?Past Surgical History:  ?Procedure Laterality Date  ? COLONOSCOPY    ? HEMORRHOID SURGERY    ? POLYPECTOMY    ? ? ?Current Medications: ?No outpatient medications have been marked as taking for the 01/07/22 encounter (Appointment) with Freada Bergeron, MD.  ?  ? ?Allergies:   Carvedilol  ? ?Social History  ? ?Socioeconomic History  ? Marital status: Married  ?  Spouse name: Not on file  ? Number of children: Not on file  ? Years of education: Not on file  ? Highest education level: Not on file  ?Occupational History  ? Not on file  ?Tobacco Use  ? Smoking status: Never  ? Smokeless tobacco: Never  ?Vaping Use  ?  Vaping Use: Never used  ?Substance and Sexual Activity  ? Alcohol use: No  ? Drug use: No  ? Sexual activity: Not on file  ?Other Topics Concern  ? Not on file  ?Social History Narrative  ? Not on file  ? ?Social Determinants of Health  ? ?Financial Resource Strain: Not on file  ?Food Insecurity: Not on file  ?Transportation Needs: Not on file  ?Physical Activity: Not on file  ?Stress: Not on file  ?Social Connections: Not on file  ?  ? ?Family History: ?The patient'sfamily history includes Alcoholism in his mother; Bone cancer in his father; Heart attack (age of onset: 30) in his father. There is no history of Colon cancer. ? ?ROS:   ?Review of Systems  ?Constitutional:  Negative for chills, diaphoresis and fever.  ?HENT:  Negative for congestion, sinus pain and sore throat.   ?Respiratory:  Negative for cough and shortness of breath.   ?Cardiovascular:  Negative for chest pain, palpitations, orthopnea, leg swelling and PND.  ?Gastrointestinal:  Negative for abdominal pain, blood in stool, constipation and nausea.  ?Genitourinary:  Negative for dysuria, hematuria and urgency.  ?Musculoskeletal:  Negative for back pain.  ?Neurological:  Negative for dizziness and headaches.  ? ? ?EKGs/Labs/Other Studies Reviewed:   ? ?The following studies were  reviewed today: ?CTA aorta 10/2021: ?IMPRESSION: ?Relatively unchanged size/configuration of the ascending aorta, ?estimated 3.9 cm on today's CT angiogram. Based on prior ?measurements, continuing annual imaging followup by CTA or MRA may ?be reasonable. This recommendation follows 2010 ?ACCF/AHA/AATS/ACR/ASA/SCA/SCAI/SIR/STS/SVM Guidelines for the ?Diagnosis and Management of Patients with Thoracic Aortic Disease. ?Circulation. 2010; 121: S854-O270. Aortic aneurysm NOS (ICD10-I71.9) ? ?TTE 12/12/17: ?Study Conclusions  ? ?- Left ventricle: The cavity size was normal. Systolic function was  ?  normal. The estimated ejection fraction was in the range of 60%  ?  to 65%. Wall  motion was normal; there were no regional wall  ?  motion abnormalities. Features are consistent with a pseudonormal  ?  left ventricular filling pattern, with concomitant abnormal  ?  relaxation and increased filling pressure (grade 2 diastolic  ?  dysfunction).  ?- Aorta: Aortic root dimension: 39 mm (ED). Ascending aortic  ?  diameter: 40 mm (S).  ?- Aortic root: The aortic root was mildly dilated.  ?- Ascending aorta: The ascending aorta was mildly dilated.  ?- Mitral valve: There was trivial regurgitation.  ?- Left atrium: The atrium was mildly dilated.  ?- Right ventricle: The cavity size was mildly dilated. Wall  ?  thickness was normal.  ?- Tricuspid valve: There was mild regurgitation. ? ?CTA 11/20/20: ?IMPRESSION: ?1. Dilated 4.0 cm diameter ascending thoracic aorta, previously 3.9 ?cm, stable to minimally increased. Recommend annual imaging followup ?by CTA or MRA. This recommendation follows 2010 ?ACCF/AHA/AATS/ACR/ASA/SCA/SCAI/SIR/STS/SVM Guidelines for the ?Diagnosis and Management of Patients with Thoracic Aortic Disease. ?Circulation. 2010; 121: J500-X381. Aortic aneurysm NOS ?(ICD10-I71.9). ?2. No active pulmonary disease.  ? ?EKG:   ?5/18-Normal sinus rhythm, rate: 60  ? ?Recent Labs: ?03/20/2021: ALT 34; Hemoglobin 14.6; Platelets 145 ?11/19/2021: BUN 17; Creatinine, Ser 1.22; Potassium 4.0; Sodium 142  ?Recent Lipid Panel ?   ?Component Value Date/Time  ? CHOL 157 11/08/2019 0751  ? TRIG 119 11/08/2019 0751  ? HDL 36 (L) 11/08/2019 0751  ? CHOLHDL 4.4 11/08/2019 0751  ? CHOLHDL 4.6 12/09/2015 0502  ? VLDL 14 12/09/2015 0502  ? [place] 99 11/08/2019 0751  ? ? ? ? ?Physical Exam:   ? ?VS:  There were no vitals taken for this visit.   ? ?Wt Readings from Last 3 Encounters:  ?03/20/21 231 lb 14.4 oz (105.2 kg)  ?03/18/21 235 lb (106.6 kg)  ?01/17/21 240 lb 9.6 oz (109.1 kg)  ?  ? ?GEN: Well nourished, well developed in no acute distress ?HEENT: Normal ?NECK: No JVD; No carotid bruits ?LYMPHATICS: No  lymphadenopathy ?CARDIAC: RRR, no murmurs, rubs, gallops, +2 pulse  ?RESPIRATORY:  Clear to auscultation without rales, wheezing or rhonchi  ?ABDOMEN: Soft, non-tender, non-distended ?MUSCULOSKELETAL:  No edema; No deformity. 1-2+ DP/PT pulses. LE warm ?SKIN: Warm and dry ?NEUROLOGIC:  Alert and oriented x 3 ?PSYCHIATRIC:  Normal affect  ? ?ASSESSMENT:   ? ?No diagnosis found. ? ?PLAN:   ? ?In order of problems listed above: ? ?#Mildly dilated ascending aorta: ?Measured 3.9 on last CTA 10/2021 which was stable from prior. Will continue with annual monitoring ?-Next CTA/MRA aorta needed 11/2022 ?-Continue ASA '81mg'$  daily ?-Continue lisinopril '5mg'$  daily ? ?#HTN: ?Very well controlled at home. ?-Continue lisinopril '5mg'$  daily ? ?Medication Adjustments/Labs and Tests Ordered: ?Current medicines are reviewed at length with the patient today.  Concerns regarding medicines are outlined above.  ?No orders of the defined types were placed in this encounter. ? ?No orders of the defined types were placed in  this encounter. ? ? ?There are no Patient Instructions on file for this visit. ?  ? ? ?I,Alexis Bryant,acting as a Education administrator for Freada Bergeron, MD.,have documented all relevant documentation on the behalf of Freada Bergeron, MD,as directed by  Freada Bergeron, MD while in the presence of Freada Bergeron, MD. ? ?I, Freada Bergeron, MD, have reviewed all documentation for this visit. The documentation on 01/03/22 for the exam, diagnosis, procedures, and orders are all accurate and complete. ?Signed, ?Freada Bergeron, MD  ?01/03/2022 8:20 PM    ?[place] ?

## 2022-01-07 ENCOUNTER — Ambulatory Visit: Payer: Medicare HMO | Admitting: Cardiology

## 2022-01-07 ENCOUNTER — Encounter: Payer: Self-pay | Admitting: Cardiology

## 2022-01-07 VITALS — BP 124/76 | HR 63 | Ht 71.0 in | Wt 234.4 lb

## 2022-01-07 DIAGNOSIS — Z79899 Other long term (current) drug therapy: Secondary | ICD-10-CM

## 2022-01-07 DIAGNOSIS — E7800 Pure hypercholesterolemia, unspecified: Secondary | ICD-10-CM | POA: Diagnosis not present

## 2022-01-07 DIAGNOSIS — I10 Essential (primary) hypertension: Secondary | ICD-10-CM

## 2022-01-07 DIAGNOSIS — R600 Localized edema: Secondary | ICD-10-CM | POA: Diagnosis not present

## 2022-01-07 DIAGNOSIS — I77810 Thoracic aortic ectasia: Secondary | ICD-10-CM | POA: Diagnosis not present

## 2022-01-07 MED ORDER — ROSUVASTATIN CALCIUM 10 MG PO TABS
10.0000 mg | ORAL_TABLET | Freq: Every day | ORAL | 3 refills | Status: DC
Start: 2022-01-07 — End: 2022-03-01

## 2022-01-07 NOTE — Patient Instructions (Signed)
Medication Instructions:  ?Start Crestor 10 mg daily  ? ?*If you need a refill on your cardiac medications before your next appointment, please call your pharmacy* ? ? ?Lab Work: ?Fasting Lipid panel in 6-8 weeks  ? ?If you have labs (blood work) drawn today and your tests are completely normal, you will receive your results only by: ?MyChart Message (if you have MyChart) OR ?A paper copy in the mail ?If you have any lab test that is abnormal or we need to change your treatment, we will call you to review the results. ? ? ?Testing/Procedures: ?none ? ? ?Follow-Up: ?At [facility], you and your health needs are our priority.  As part of our continuing mission to provide you with exceptional heart care, we have created designated Provider Care Teams.  These Care Teams include your primary Cardiologist (physician) and Advanced Practice Providers (APPs -  Physician Assistants and Nurse Practitioners) who all work together to provide you with the care you need, when you need it. ? ?We recommend signing up for the patient portal called "MyChart".  Sign up information is provided on this After Visit Summary.  MyChart is used to connect with patients for Virtual Visits (Telemedicine).  Patients are able to view lab/test results, encounter notes, upcoming appointments, etc.  Non-urgent messages can be sent to your provider as well.   ?To learn more about what you can do with MyChart, go to [email or web address].   ? ?Your next appointment:   ?1 year(s) ? ?The format for your next appointment:   ?In Person ? ?Dr Gwyndolyn Kaufman  ? ?If primary card or EP is not listed click here to update    :1}  ? ? ?Other Instructions ? ? ?Important Information About Sugar ? ? ? ? ?  ?

## 2022-01-07 NOTE — Progress Notes (Signed)
?Cardiology Office Note:   ? ?Date:  01/07/2022  ? ?ID:  Curtis Warren, DOB 1956-04-27, MRN [id number] ? ?PCP:  Carol Ada, MD ?  ?[facility] Providers ?Cardiologist:  Ena Dawley, MD { ? ?Referring MD: Carol Ada, MD  ? ? ?History of Present Illness:   ? ?Curtis Warren is a 66 y.o. male with a hx of HTN and borderline ascending aortic aneurysm who was previously followed by Dr. Meda Coffee who now returns to clinic for follow-up. ? ?Per review of the record, he had a prior low risk Myoview in 2017. TTE in 2017 showed an EF of 65 to 70% with grade 1 diastolic dysfunction. He was put on low dose Lasix then for an elevated LVEDP. His last CTA chest on 11/20/20 demonstrated a dilated ascending 4.0cm. Was recommended for annual follow-up. ? ?Was last seen in clinic on 12/2020 for preop visit prior to toenail removal. He was active with no exertional symptoms at that time and no further testing was needed. ? ?Had CTA for monitoring dilated aorta on 10/2021 which showed ascending aorta measuring 3.9cm. ? ?Today, the patient states that he is feeling good, without chest pain, shortness of breath, or any swelling. ? ?He remains compliant with furosemide daily. No LE edema. ? ?For exercise he usually walks at least 1 mile every day with no anginal symptoms. He also enjoys golfing frequently. ? ?He denies any palpitations. No lightheadedness, headaches, syncope, orthopnea, or PND. ? ? ?Past Medical History:  ?Diagnosis Date  ? AAA (abdominal aortic aneurysm) ([facility])   ? Back pain   ? BPH (benign prostatic hyperplasia)   ? Diastolic dysfunction without heart failure   ? Dilatation of thoracic aorta (HCC)   ? Essential hypertension   ? Obesity   ? Pre-diabetes   ? ? ?Past Surgical History:  ?Procedure Laterality Date  ? COLONOSCOPY    ? HEMORRHOID SURGERY    ? POLYPECTOMY    ? ? ?Current Medications: ?Current Meds  ?Medication Sig  ? aspirin EC 81 MG tablet Take 81 mg by mouth daily.  ? cetirizine (ZYRTEC) 10 MG  tablet Take 10 mg by mouth as needed.   ? finasteride (PROSCAR) 5 MG tablet Take 5 mg by mouth daily.  ? furosemide (LASIX) 20 MG tablet Take 1 tablet (20 mg total) by mouth daily.  ? HYDROcodone-acetaminophen (NORCO) 10-325 MG tablet as needed.  ? lisinopril (ZESTRIL) 5 MG tablet Take 1 tablet (5 mg total) by mouth daily.  ? rosuvastatin (CRESTOR) 10 MG tablet Take 1 tablet (10 mg total) by mouth daily.  ? Tamsulosin HCl (FLOMAX) 0.4 MG CAPS Take 0.4 mg by mouth daily. Reported on 12/08/2015  ?  ? ?Allergies:   Carvedilol  ? ?Social History  ? ?Socioeconomic History  ? Marital status: Married  ?  Spouse name: Not on file  ? Number of children: Not on file  ? Years of education: Not on file  ? Highest education level: Not on file  ?Occupational History  ? Not on file  ?Tobacco Use  ? Smoking status: Never  ? Smokeless tobacco: Never  ?Vaping Use  ? Vaping Use: Never used  ?Substance and Sexual Activity  ? Alcohol use: No  ? Drug use: No  ? Sexual activity: Not on file  ?Other Topics Concern  ? Not on file  ?Social History Narrative  ? Not on file  ? ?Social Determinants of Health  ? ?Financial Resource Strain: Not on file  ?Food Insecurity:  Not on file  ?Transportation Needs: Not on file  ?Physical Activity: Not on file  ?Stress: Not on file  ?Social Connections: Not on file  ?  ? ?Family History: ?The patient'sfamily history includes Alcoholism in his mother; Bone cancer in his father; Heart attack (age of onset: 35) in his father. There is no history of Colon cancer. ? ?ROS:   ?Review of Systems  ?Constitutional:  Negative for chills, diaphoresis and fever.  ?HENT:  Negative for congestion, sinus pain and sore throat.   ?Eyes:  Negative for double vision.  ?Respiratory:  Negative for cough and shortness of breath.   ?Cardiovascular:  Negative for chest pain, palpitations, orthopnea, leg swelling and PND.  ?Gastrointestinal:  Negative for abdominal pain, blood in stool, constipation and nausea.  ?Genitourinary:   Negative for dysuria, hematuria and urgency.  ?Musculoskeletal:  Negative for back pain.  ?Neurological:  Negative for dizziness and headaches.  ?Psychiatric/Behavioral:  Negative for depression.   ? ? ?EKGs/Labs/Other Studies Reviewed:   ? ?The following studies were reviewed today: ? ?CTA aorta 10/2021: ?IMPRESSION: ?Relatively unchanged size/configuration of the ascending aorta, ?estimated 3.9 cm on today's CT angiogram. Based on prior ?measurements, continuing annual imaging followup by CTA or MRA may ?be reasonable. This recommendation follows 2010 ?ACCF/AHA/AATS/ACR/ASA/SCA/SCAI/SIR/STS/SVM Guidelines for the ?Diagnosis and Management of Patients with Thoracic Aortic Disease. ?Circulation. 2010; 121: T016-W109. Aortic aneurysm NOS (ICD10-I71.9) ? ?CTA 11/20/20: ?IMPRESSION: ?1. Dilated 4.0 cm diameter ascending thoracic aorta, previously 3.9 ?cm, stable to minimally increased. Recommend annual imaging followup ?by CTA or MRA. This recommendation follows 2010 ?ACCF/AHA/AATS/ACR/ASA/SCA/SCAI/SIR/STS/SVM Guidelines for the ?Diagnosis and Management of Patients with Thoracic Aortic Disease. ?Circulation. 2010; 121: N235-T732. Aortic aneurysm NOS ?(ICD10-I71.9). ?2. No active pulmonary disease.  ? ?TTE 12/12/17: ?Study Conclusions  ? ?- Left ventricle: The cavity size was normal. Systolic function was  ?  normal. The estimated ejection fraction was in the range of 60%  ?  to 65%. Wall motion was normal; there were no regional wall  ?  motion abnormalities. Features are consistent with a pseudonormal  ?  left ventricular filling pattern, with concomitant abnormal  ?  relaxation and increased filling pressure (grade 2 diastolic  ?  dysfunction).  ?- Aorta: Aortic root dimension: 39 mm (ED). Ascending aortic  ?  diameter: 40 mm (S).  ?- Aortic root: The aortic root was mildly dilated.  ?- Ascending aorta: The ascending aorta was mildly dilated.  ?- Mitral valve: There was trivial regurgitation.  ?- Left atrium: The  atrium was mildly dilated.  ?- Right ventricle: The cavity size was mildly dilated. Wall  ?  thickness was normal.  ?- Tricuspid valve: There was mild regurgitation. ? ?EKG:  EKG is personally reviewed. ?01/07/2022: Sinus rhythm. Rate 63 bpm. ?5/18-Normal sinus rhythm, rate: 60  ? ?Recent Labs: ?03/20/2021: ALT 34; Hemoglobin 14.6; Platelets 145 ?11/19/2021: BUN 17; Creatinine, Ser 1.22; Potassium 4.0; Sodium 142  ? ?Recent Lipid Panel ?   ?Component Value Date/Time  ? CHOL 157 11/08/2019 0751  ? TRIG 119 11/08/2019 0751  ? HDL 36 (L) 11/08/2019 0751  ? CHOLHDL 4.4 11/08/2019 0751  ? CHOLHDL 4.6 12/09/2015 0502  ? VLDL 14 12/09/2015 0502  ? [place] 99 11/08/2019 0751  ? ? ? ?Physical Exam:   ? ?VS:  BP 124/76   Pulse 63   Ht '5\' 11"'$  (1.803 m)   Wt 234 lb 6.4 oz (106.3 kg)   SpO2 97%   BMI 32.69 kg/m?    ? ?Wt  Readings from Last 3 Encounters:  ?01/07/22 234 lb 6.4 oz (106.3 kg)  ?03/20/21 231 lb 14.4 oz (105.2 kg)  ?03/18/21 235 lb (106.6 kg)  ?  ? ?GEN: Well nourished, well developed in no acute distress ?HEENT: Normal ?NECK: No JVD; No carotid bruits ?CARDIAC: RRR, no murmurs, rubs, gallops, +2 pulse  ?RESPIRATORY:  Clear to auscultation without rales, wheezing or rhonchi  ?ABDOMEN: Soft, non-tender, non-distended ?MUSCULOSKELETAL:  No edema; No deformity. LE warm ?SKIN: Warm and dry ?NEUROLOGIC:  Alert and oriented x 3 ?PSYCHIATRIC:  Normal affect  ? ?ASSESSMENT:   ? ?1. Ascending aorta dilation (HCC)   ?2. Essential hypertension   ?3. Leg edema   ?4. Pure hypercholesterolemia   ?5. Medication management   ? ? ?PLAN:   ? ?In order of problems listed above: ? ?#Mildly dilated ascending aorta: ?Measured 3.9 on last CTA 10/2021 which was stable from prior. Will continue with annual monitoring ?-Next CTA/MRA aorta needed 11/2022 ?-Continue ASA '81mg'$  daily ?-Continue lisinopril '5mg'$  daily ? ?#HTN: ?Very well controlled at home. ?-Continue lisinopril '5mg'$  daily ? ?#HLD: ?LDL 103. Patient is interested in starting statin at  this time. ?-Start crestor '10mg'$  daily ?-Lipids in 6-8 weeks ? ?#Chronic LE edema: ?Well controlled on lasix '20mg'$  daily. TTE 2019 with normal BiV function, G2DD. ?-Continue lasix '20mg'$  daily ? ?Follow-up:  1 yea

## 2022-01-15 ENCOUNTER — Other Ambulatory Visit (HOSPITAL_COMMUNITY): Payer: Self-pay

## 2022-01-15 ENCOUNTER — Emergency Department (HOSPITAL_COMMUNITY): Payer: Medicare HMO

## 2022-01-15 ENCOUNTER — Telehealth: Payer: Self-pay | Admitting: Cardiology

## 2022-01-15 ENCOUNTER — Emergency Department (HOSPITAL_COMMUNITY)
Admission: EM | Admit: 2022-01-15 | Discharge: 2022-01-15 | Disposition: A | Discharge status: Home / Self Care | Payer: Medicare HMO | Attending: Emergency Medicine | Admitting: Emergency Medicine

## 2022-01-15 ENCOUNTER — Other Ambulatory Visit: Payer: Self-pay

## 2022-01-15 DIAGNOSIS — Z7982 Long term (current) use of aspirin: Secondary | ICD-10-CM | POA: Insufficient documentation

## 2022-01-15 DIAGNOSIS — D72819 Decreased white blood cell count, unspecified: Secondary | ICD-10-CM | POA: Diagnosis not present

## 2022-01-15 DIAGNOSIS — I700 Atherosclerosis of aorta: Secondary | ICD-10-CM | POA: Insufficient documentation

## 2022-01-15 DIAGNOSIS — I878 Other specified disorders of veins: Secondary | ICD-10-CM | POA: Diagnosis not present

## 2022-01-15 DIAGNOSIS — N209 Urinary calculus, unspecified: Secondary | ICD-10-CM | POA: Diagnosis not present

## 2022-01-15 DIAGNOSIS — N3289 Other specified disorders of bladder: Secondary | ICD-10-CM | POA: Diagnosis not present

## 2022-01-15 DIAGNOSIS — R1011 Right upper quadrant pain: Secondary | ICD-10-CM | POA: Diagnosis not present

## 2022-01-15 DIAGNOSIS — R109 Unspecified abdominal pain: Secondary | ICD-10-CM | POA: Diagnosis not present

## 2022-01-15 DIAGNOSIS — R69 Illness, unspecified: Secondary | ICD-10-CM | POA: Diagnosis not present

## 2022-01-15 DIAGNOSIS — K659 Peritonitis, unspecified: Secondary | ICD-10-CM | POA: Diagnosis not present

## 2022-01-15 DIAGNOSIS — F1090 Alcohol use, unspecified, uncomplicated: Secondary | ICD-10-CM | POA: Diagnosis not present

## 2022-01-15 DIAGNOSIS — N4404 Torsion of appendix epididymis: Secondary | ICD-10-CM | POA: Diagnosis not present

## 2022-01-15 DIAGNOSIS — K388 Other specified diseases of appendix: Secondary | ICD-10-CM | POA: Diagnosis not present

## 2022-01-15 DIAGNOSIS — K6389 Other specified diseases of intestine: Secondary | ICD-10-CM

## 2022-01-15 LAB — CBC
HCT: 45.2 % (ref 39.0–52.0)
Hemoglobin: 15.7 g/dL (ref 13.0–17.0)
MCH: 31.3 pg (ref 26.0–34.0)
MCHC: 34.7 g/dL (ref 30.0–36.0)
MCV: 90.2 fL (ref 80.0–100.0)
Platelets: 170 10*3/uL (ref 150–400)
RBC: 5.01 MIL/uL (ref 4.22–5.81)
RDW: 12.5 % (ref 11.5–15.5)
WBC: 9.5 10*3/uL (ref 4.0–10.5)
nRBC: 0 % (ref 0.0–0.2)

## 2022-01-15 LAB — COMPREHENSIVE METABOLIC PANEL
ALT: 31 U/L (ref 0–44)
AST: 22 U/L (ref 15–41)
Albumin: 4 g/dL (ref 3.5–5.0)
Alkaline Phosphatase: 101 U/L (ref 38–126)
Anion gap: 7 (ref 5–15)
BUN: 16 mg/dL (ref 8–23)
CO2: 24 mmol/L (ref 22–32)
Calcium: 9.4 mg/dL (ref 8.9–10.3)
Chloride: 109 mmol/L (ref 98–111)
Creatinine, Ser: 1.26 mg/dL — ABNORMAL HIGH (ref 0.61–1.24)
GFR, Estimated: >60 mL/min (ref >60)
Glucose, Bld: 132 mg/dL — ABNORMAL HIGH (ref 70–99)
Potassium: 3.8 mmol/L (ref 3.5–5.1)
Sodium: 140 mmol/L (ref 135–145)
Total Bilirubin: 1.7 mg/dL — ABNORMAL HIGH (ref 0.3–1.2)
Total Protein: 6.8 g/dL (ref 6.5–8.1)

## 2022-01-15 LAB — LIPASE, BLOOD: Lipase: 25 U/L (ref 11–51)

## 2022-01-15 MED ORDER — HYDROMORPHONE HCL 1 MG/ML IJ SOLN: 1.0000 mg | Freq: Once | INTRAMUSCULAR | Status: DC

## 2022-01-15 MED ORDER — NAPROXEN SODIUM 220 MG PO CAPS
220.0000 mg | ORAL_CAPSULE | Freq: Four times a day (QID) | ORAL | 0 refills | Status: AC | PRN
  Filled 2022-01-15: qty 20, 5d supply, fill #0

## 2022-01-15 MED ORDER — KETOROLAC TROMETHAMINE 15 MG/ML IJ SOLN
15.0000 mg | Freq: Once | INTRAMUSCULAR | Status: AC
  Administered 2022-01-15: 15 mg via INTRAVENOUS
  Filled 2022-01-15: qty 1

## 2022-01-15 MED ORDER — HYDROCODONE-ACETAMINOPHEN 5-325 MG PO TABS
1.0000 | ORAL_TABLET | ORAL | 0 refills | Status: AC | PRN
  Filled 2022-01-15: qty 10, 2d supply, fill #0

## 2022-01-15 MED ORDER — ONDANSETRON HCL 4 MG/2ML IJ SOLN
4.0000 mg | Freq: Once | INTRAMUSCULAR | Status: AC
  Administered 2022-01-15: 4 mg via INTRAVENOUS
  Filled 2022-01-15: qty 2

## 2022-01-15 NOTE — Telephone Encounter (Signed)
Pt c/o medication issue: ? ?1. Name of Medication: rosuvastatin (CRESTOR) 10 MG tablet ? ?2. How are you currently taking this medication (dosage and times per day)? Take 1 tablet (10 mg total) by mouth daily. ? ?3. Are you having a reaction (difficulty breathing--STAT)?  ? ?4. What is your medication issue? Patient has abdominal pain.  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  They are wondering if the medication can be causing this.  As this is the only thing new he is taking.   ?

## 2022-01-15 NOTE — Discharge Instructions (Addendum)
You came to the emergency department due to abdominal pain, we did imaging and your pain is likely due to a condition called epiploic appendagitis. This involves inflammation of portions of the colon that are inflammed. This should get better on its own, we have prescribed pain medication for a few days to help with this. If your pain is not better within a few days or getting significantly worse then please return for another evaluation. Please make sure to follow up with your primary care provider and cardiologist. ?

## 2022-01-15 NOTE — ED Triage Notes (Addendum)
Pt. Stated, I started having right side stomach pain yesterday that is on the right. I cant hardly stand it. Denies any N/V/D. ?

## 2022-01-15 NOTE — ED Notes (Signed)
,  toileting  ?

## 2022-01-15 NOTE — Telephone Encounter (Signed)
Called and spoke to wife to let her know that the Crestor does not lead to this disease process. Wife states he will remain on it. ?

## 2022-01-15 NOTE — ED Triage Notes (Signed)
Pt. Stated, right side pain started yesterday ?

## 2022-01-15 NOTE — ED Notes (Signed)
Patient verbalizes understanding of discharge instructions. Opportunity for questioning and answers were provided. Pt discharged from ED. 

## 2022-01-15 NOTE — ED Provider Notes (Signed)
?[facility] ?Provider Note ? ? ?CSN: [id number] ?Arrival date & time: 01/15/22  3154 ? ?  ? ?History ?Chief Complaint  ?Patient presents with  ? Abdominal Pain  ? ? ?Curtis Warren is a 66 y.o. male. ? ?Patient presents with right upper quadrant abdominal pain that started yesterday afternoon all of a sudden, when the pain started he was resting. It feels like a combination of sharp and stabbing along with dull and aching at certain times. It has been a constant pain since it started and denies radiating pain. Rates pain currently as 7/10 but it can go up to 10/10 at its worst. Aggravating factors include certain positions including laying on that side. Denies any relieving factors. Says eating or having a BM does not change the pain. Has a normal BM pattern daily. Denies fever, chills, other pain, headaches, vision changes, dysuria, hematuria and hematochezia. Endorses occasional alcohol use. Reports eating more fried and fatty foods than he should. Only recent medication change is that his cardiologist started on crestor.  ? ?The history is provided by the patient. No language interpreter was used.  ?Abdominal Pain ?Pain location:  RUQ ?Pain quality: aching, dull, sharp and stabbing   ?Pain radiates to:  Does not radiate ?Pain severity:  Moderate ?Onset quality:  Sudden ?Duration:  1 day ?Timing:  Constant ?Progression:  Unchanged ?Chronicity:  New ?Context: not alcohol use, not diet changes, not eating, not sick contacts and not trauma   ?Relieved by:  Nothing ?Worsened by:  Movement and position changes ?Ineffective treatments:  Bowel activity ?Associated symptoms: no chest pain, no chills, no constipation, no cough, no dysuria, no fatigue, no fever, no hematemesis, no hematochezia, no hematuria, no nausea, no shortness of breath and no vomiting   ?Risk factors: no alcohol abuse   ? ?  ? ?Home Medications ?Prior to Admission medications   ?Medication Sig Start Date End  Date Taking? Authorizing Provider  ?aspirin EC 81 MG tablet Take 81 mg by mouth daily.   Yes [provider]  ?cetirizine (ZYRTEC) 10 MG tablet Take 10 mg by mouth as needed for allergies or rhinitis.   Yes [provider]  ?finasteride (PROSCAR) 5 MG tablet Take 5 mg by mouth daily.   Yes [provider]  ?furosemide (LASIX) 20 MG tablet Take 1 tablet (20 mg total) by mouth daily. 11/27/21  Yes Freada Bergeron, MD  ?latanoprost (XALATAN) 0.005 % ophthalmic solution Place 1 drop into the right eye at bedtime.   Yes [provider]  ?lisinopril (ZESTRIL) 5 MG tablet Take 1 tablet (5 mg total) by mouth daily. 11/27/21  Yes Freada Bergeron, MD  ?rosuvastatin (CRESTOR) 10 MG tablet Take 1 tablet (10 mg total) by mouth daily. 01/07/22  Yes Freada Bergeron, MD  ?Tamsulosin HCl (FLOMAX) 0.4 MG CAPS Take 0.4 mg by mouth daily. Reported on 12/08/2015   Yes [provider]  ?HYDROcodone-acetaminophen (NORCO/VICODIN) 5-325 MG tablet Take 1 tablet by mouth every 4 (four) hours as needed for moderate pain. 01/15/22 01/15/23 Yes Amarise Lillo, Lovette Cliche, DO  ?Naproxen Sodium (ALEVE) 220 MG CAPS Take 1 capsule (220 mg total) by mouth every 6 (six) hours as needed for up to 5 days. 01/15/22 01/20/22 Yes Aanyah Loa, DO  ?   ? ?Allergies    ?Carvedilol   ? ?Review of Systems   ?Review of Systems  ?Constitutional:  Negative for chills, fatigue and fever.  ?HENT:  Negative for congestion.   ?  Eyes:  Negative for visual disturbance.  ?Respiratory:  Negative for cough and shortness of breath.   ?Cardiovascular:  Negative for chest pain.  ?Gastrointestinal:  Positive for abdominal pain. Negative for constipation, hematemesis, hematochezia, nausea and vomiting.  ?Genitourinary:  Negative for dysuria and hematuria.  ?Musculoskeletal:  Negative for back pain.  ?Neurological:  Negative for headaches.  ? ?Physical Exam ?Updated Vital Signs ?BP 130/84   Pulse 60   Temp 97.9 ?F (36.6 ?C) (Oral)   Resp 19    Ht '5\' 6"'$  (1.676 m)   Wt 95.3 kg   SpO2 95%   BMI 33.89 kg/m?  ?Physical Exam ?Vitals reviewed.  ?Constitutional:   ?   General: He is in acute distress (mild acute distress).  ?   Appearance: He is well-developed.  ?HENT:  ?   Head: Normocephalic and atraumatic.  ?   Mouth/Throat:  ?   Mouth: Mucous membranes are moist.  ?   Pharynx: Oropharynx is clear. No pharyngeal swelling.  ?Eyes:  ?   General: No scleral icterus. ?   Pupils: Pupils are equal, round, and reactive to light.  ?Cardiovascular:  ?   Rate and Rhythm: Normal rate and regular rhythm.  ?   Heart sounds: No murmur heard. ?  No friction rub. No gallop.  ?Pulmonary:  ?   Effort: Pulmonary effort is normal. No respiratory distress.  ?   Breath sounds: Normal breath sounds. No wheezing, rhonchi or rales.  ?Abdominal:  ?   General: Bowel sounds are normal. There is no distension.  ?   Palpations: Abdomen is soft. There is no splenomegaly, mass or pulsatile mass.  ?   Tenderness: There is abdominal tenderness in the right upper quadrant. There is no right CVA tenderness, left CVA tenderness or guarding. Negative signs include McBurney's sign and psoas sign.  ?   Hernia: No hernia is present.  ?Skin: ?   General: Skin is warm and dry.  ?   Capillary Refill: Capillary refill takes less than 2 seconds.  ?   Findings: No rash.  ?Neurological:  ?   General: No focal deficit present.  ?   Mental Status: He is alert and oriented to person, place, and time.  ?   Motor: No weakness.  ?Psychiatric:     ?   Mood and Affect: Mood normal.     ?   Behavior: Behavior normal.  ? ? ?ED Results / Procedures / Treatments   ?Labs ?(all labs ordered are listed, but only abnormal results are displayed) ?Labs Reviewed  ?COMPREHENSIVE METABOLIC PANEL - Abnormal; Notable for the following components:  ?    Result Value  ? Glucose, Bld 132 (*)   ? Creatinine, Ser 1.26 (*)   ? Total Bilirubin 1.7 (*)   ? All other components within normal limits  ?LIPASE, BLOOD  ?CBC   ?URINALYSIS, ROUTINE W REFLEX MICROSCOPIC  ?URINALYSIS, ROUTINE W REFLEX MICROSCOPIC  ? ? ?EKG ?None ? ?Radiology ?CT Renal Stone Study ? ?Result Date: 01/15/2022 ?CLINICAL DATA:  Right flank pain EXAM: CT ABDOMEN AND PELVIS WITHOUT CONTRAST TECHNIQUE: Multidetector CT imaging of the abdomen and pelvis was performed following the standard protocol without IV contrast. RADIATION DOSE REDUCTION: This exam was performed according to the departmental dose-optimization program which includes automated exposure control, adjustment of the mA and/or kV according to patient size and/or use of iterative reconstruction technique. COMPARISON:  03/18/2021 and previous FINDINGS: Lower chest: No pleural or pericardial effusion. Hepatobiliary: No focal liver  abnormality is seen. No gallstones, gallbladder wall thickening, or biliary dilatation. Pancreas: Unremarkable. No pancreatic ductal dilatation or surrounding inflammatory changes. Spleen: Normal in size without focal abnormality. Adrenals/Urinary Tract: No adrenal mass. Poorly marginated 1.5 cm hyperdense lesion in the right upper pole, which was partially visualized on scans dating back to 12/08/2015 suggesting hyperdense cyst; no follow-up recommended. Bilateral urolithiasis, largest 3 mm in the right lower pole. There is also a 3 mm left lower pole calculus. No hydronephrosis or ureterectasis. Urinary bladder incompletely distended. Stomach/Bowel: Stomach is incompletely distended, unremarkable. Small bowel decompressed. Normal appendix. Focal mild inflammatory/edematous change in fat at the lateral margin of the cecum. The colon is nondilated with a somewhat redundant sigmoid segment, otherwise unremarkable. Vascular/Lymphatic: Mild scattered aortoiliac calcified atheromatous plaque without aneurysm. No abdominal or pelvic adenopathy. Reproductive: Prostate is unremarkable. Other: Bilateral pelvic phleboliths.  No ascites.  No free air. Musculoskeletal: No acute or  significant osseous findings. IMPRESSION: 1. Bilateral nonobstructive urolithiasis. 2. Normal appendix. 3. Focal inflammatory/edematous process lateral to the cecum suggesting epiploic appendagitis. 4.  Aortic Atherosclerosis

## 2022-01-15 NOTE — Telephone Encounter (Signed)
Rosuvastatin does not cause this. ?

## 2022-01-21 ENCOUNTER — Telehealth: Payer: Self-pay | Admitting: *Deleted

## 2022-01-21 NOTE — Telephone Encounter (Signed)
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 Johney Frame to fill out.  Form received and placed in Dr. Jacolyn Reedy folder to further review and sign, upon return to the office.  Will call the pt back thereafter when form is completed.

## 2022-01-22 NOTE — Telephone Encounter (Signed)
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   Pt states there is a fax number on the form where our office should send this to.  He is requesting that I send completed form to [facility]: Campbell Stall MSN, APRN, FNP-C Fax #[phone] Telephone [phone].  Will fax requested form to contact number provided.  Informed the pt that I will mail him his hard copy to his confirmed mailing address on file.  Pt verbalized understanding and agrees with this plan.  Pt was more than gracious for all the assistance provided.

## 2022-01-22 NOTE — Telephone Encounter (Signed)
Form faxed and confirmation received.  Hard copy mailed to the pts home address on file.

## 2022-01-28 IMAGING — CT CT RENAL STONE PROTOCOL
2 of 4 series · 16 of 46 positions shown, 18 images · non-contrast
Comparison: None.

CLINICAL DATA: 65-year-old male with acute RIGHT flank and
abdominal pain today.

EXAM:
CT ABDOMEN AND PELVIS WITHOUT CONTRAST
TECHNIQUE: Multidetector CT imaging of the abdomen and pelvis was performed
following the standard protocol without IV contrast.

[Series 3: ap without · axial · non-contrast · 0.85mm/px · z∈[+856,+1306]mm · 13 of 103 slices shown, 15 images]
[im 7/103  soft-tissue]
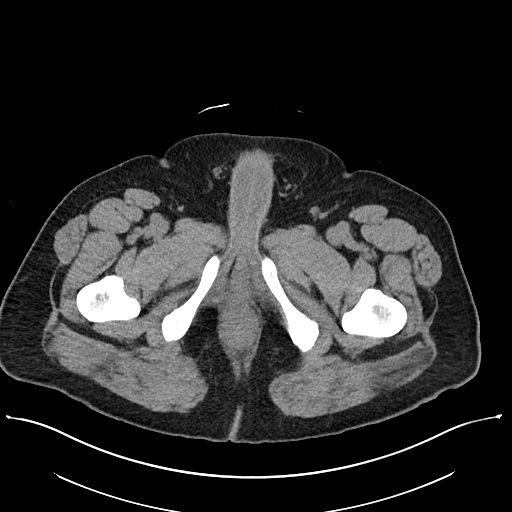
[im 7/103  bone]
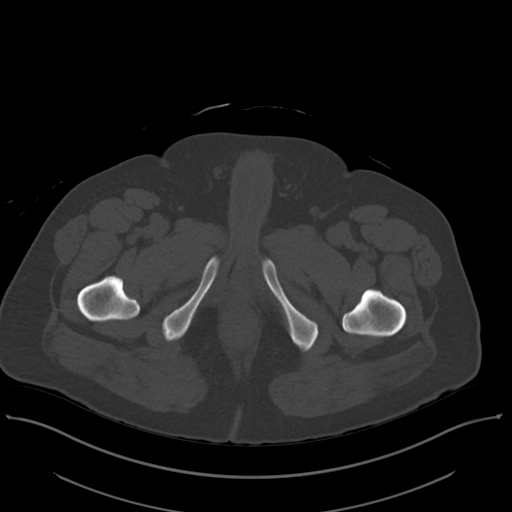
[im 13/103  soft-tissue]
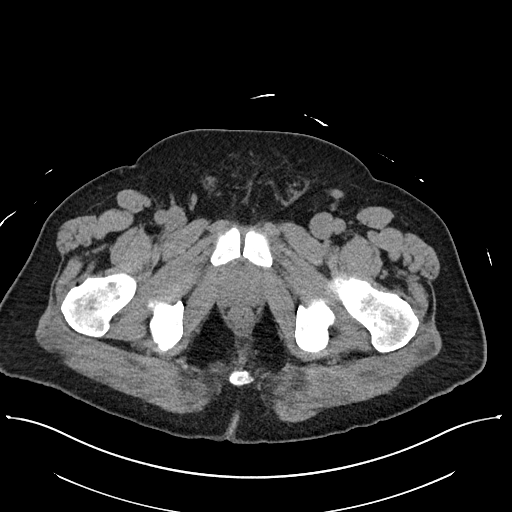
[im 25/103  soft-tissue]
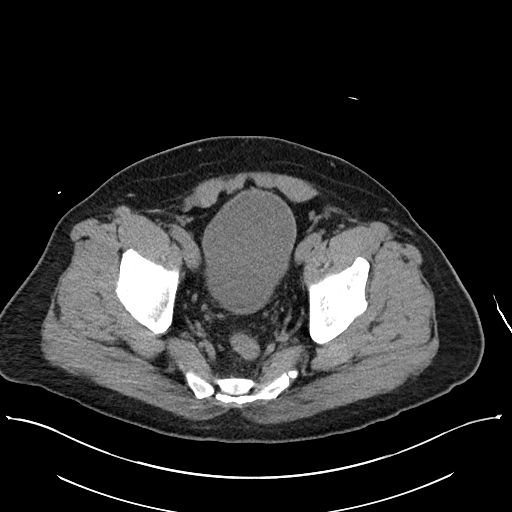
[im 31/103  soft-tissue]
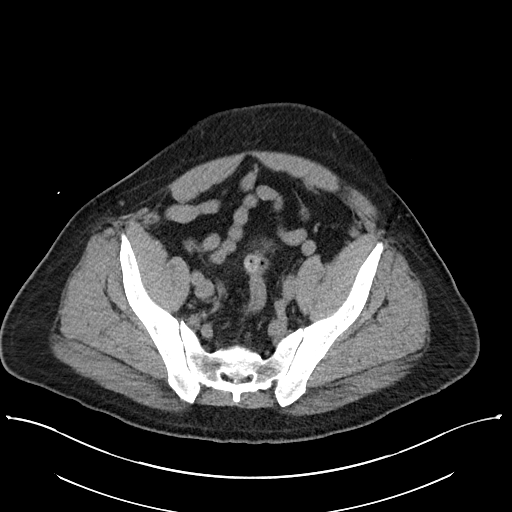
[im 37/103  soft-tissue]
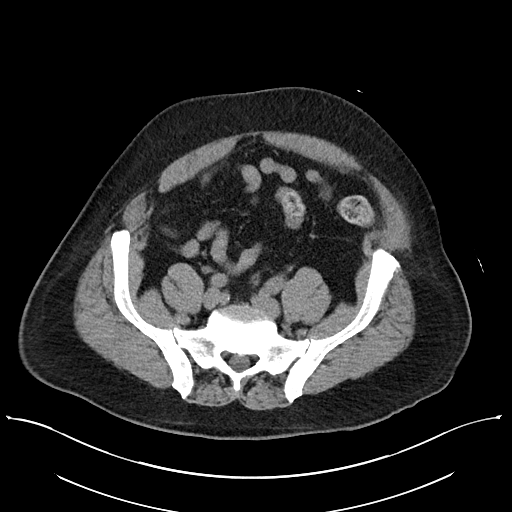
[im 43/103  soft-tissue]
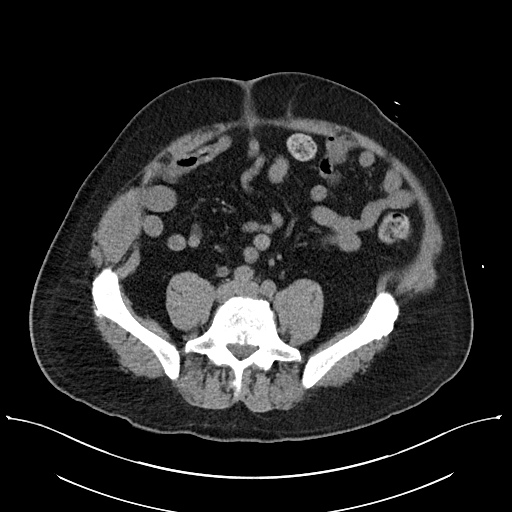
[im 55/103  soft-tissue]
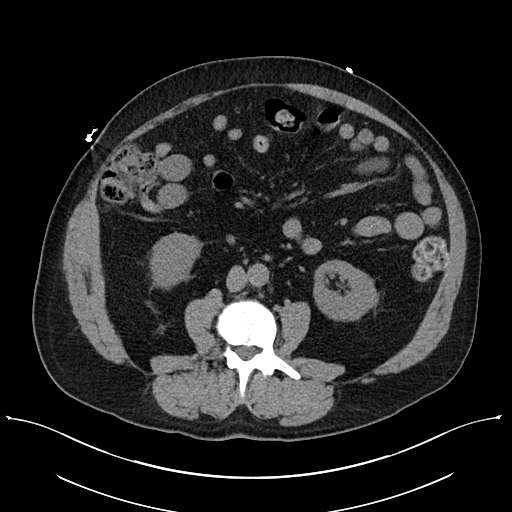
[im 61/103  soft-tissue]
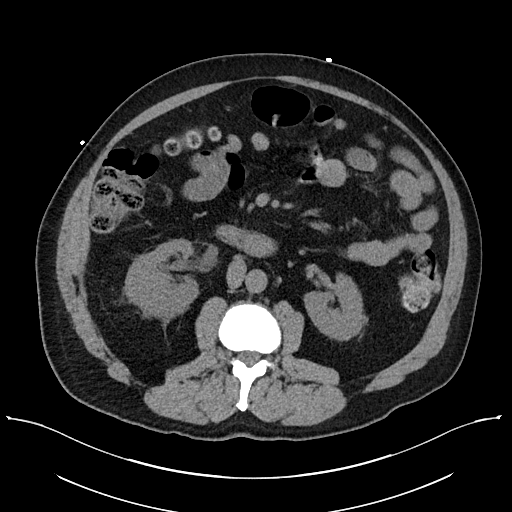
[im 67/103  soft-tissue]
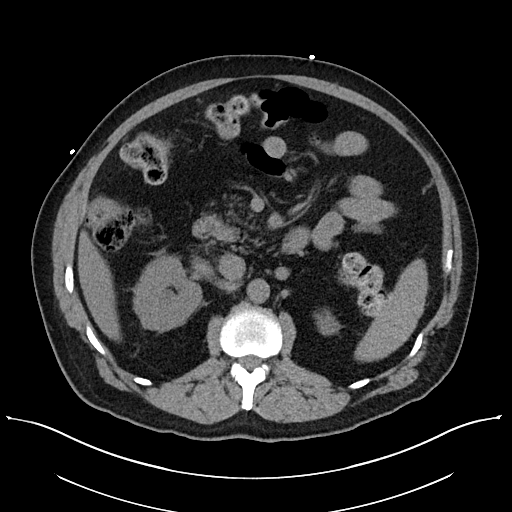
[im 67/103  bone]
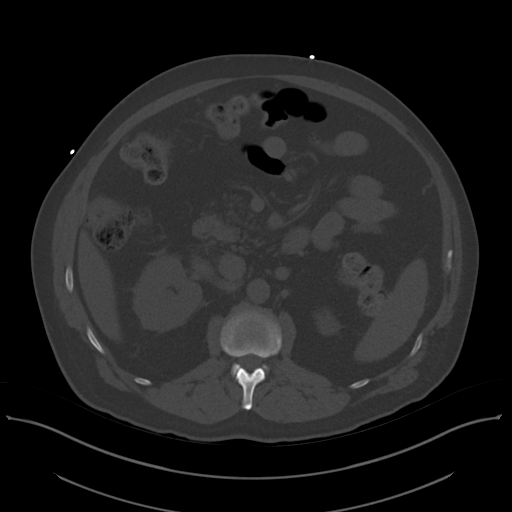
[im 73/103  soft-tissue]
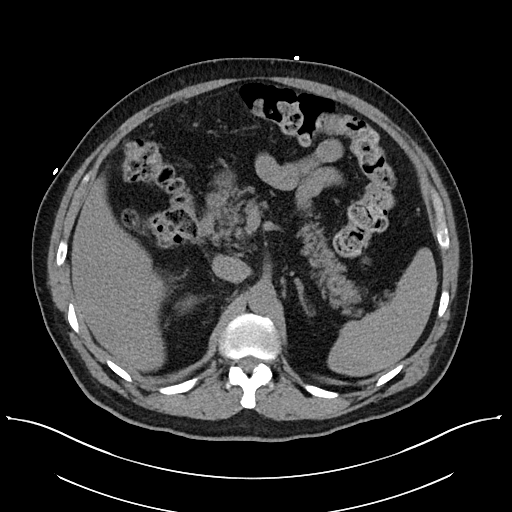
[im 79/103  soft-tissue]
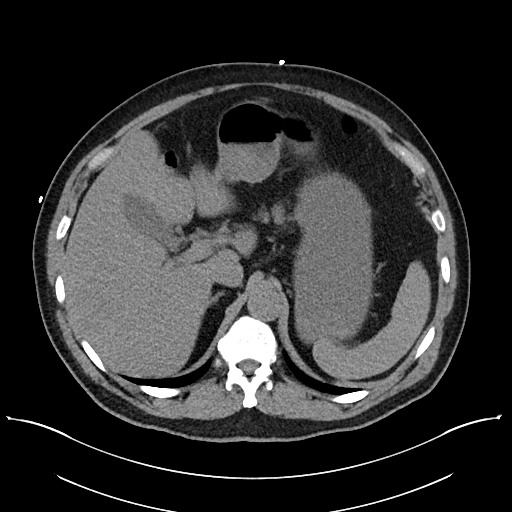
[im 91/103  soft-tissue]
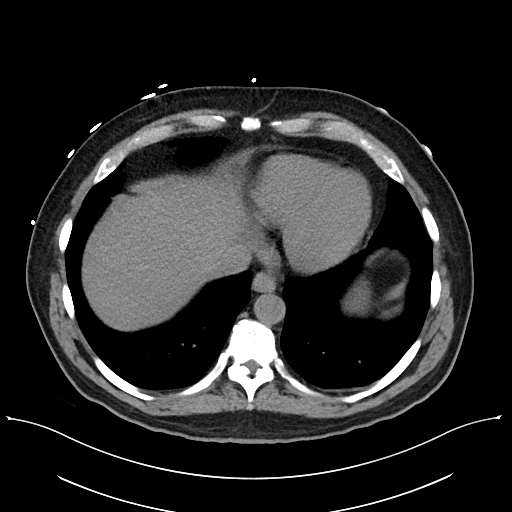
[im 97/103  soft-tissue]
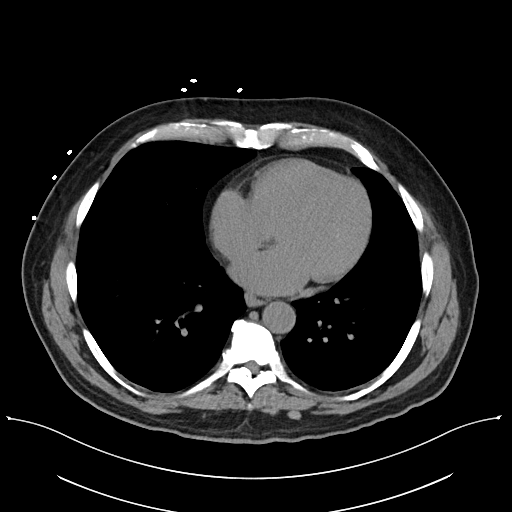

[Series 6: cor · coronal · 0.84mm/px · 3 of 121 slices shown]
[im 41/121  soft-tissue]
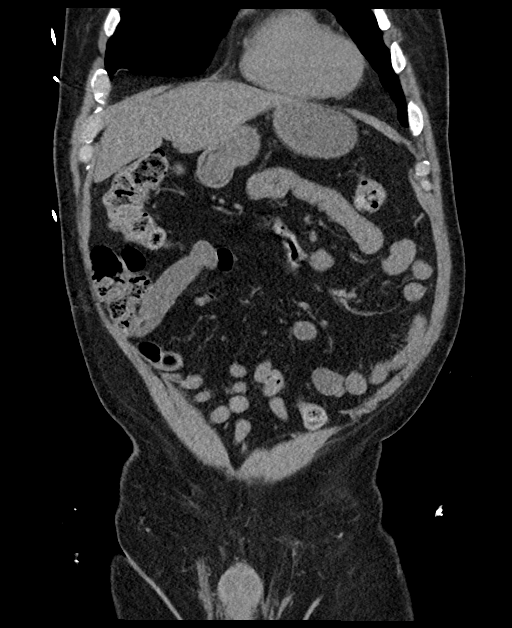
[im 54/121  soft-tissue]
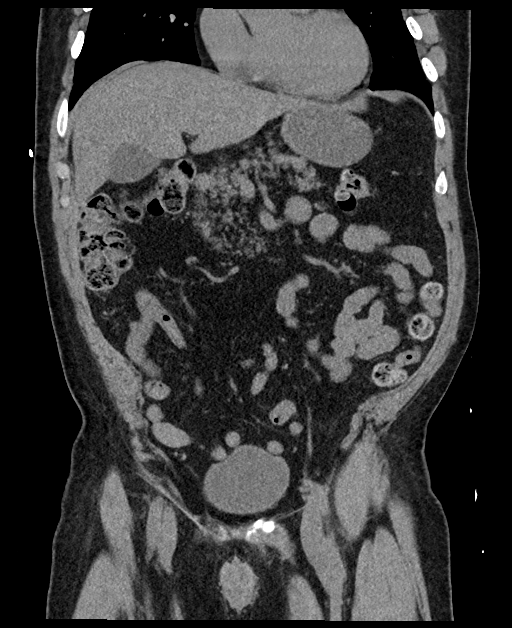
[im 67/121  soft-tissue]
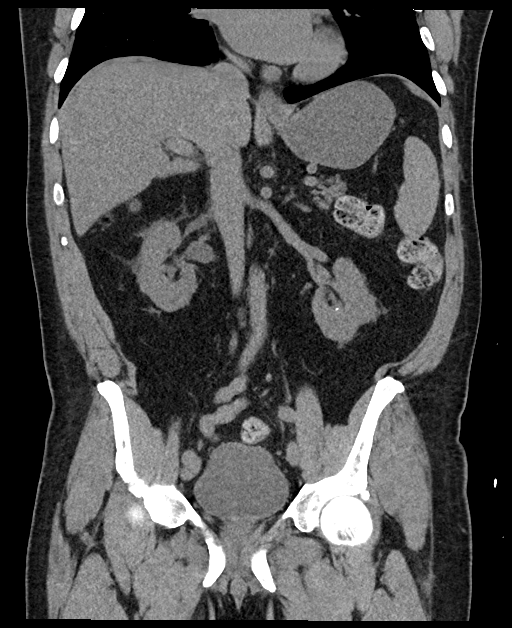

[16 of 46 positions shown; findings below may reference images not displayed]

FINDINGS: Please note that parenchymal abnormalities may be missed without
intravenous contrast.

Lower chest: No acute abnormality.

Hepatobiliary: Hepatic steatosis noted without focal hepatic
abnormality. The gallbladder is unremarkable. No biliary dilatation.

Pancreas: Unremarkable

Spleen: Unremarkable

Adrenals/Urinary Tract: A 4 mm RIGHT UVJ calculus causes mild RIGHT
hydronephrosis.

A 3 mm nonobstructing RIGHT LOWER pole calculus and a punctate
nonobstructing LEFT LOWER pole renal calculus is noted.

A RIGHT renal cyst is again noted. The adrenal glands and bladder
are unremarkable.

Stomach/Bowel: Stomach is within normal limits. No evidence of bowel
wall thickening, distention, or inflammatory changes.

Vascular/Lymphatic: Aortic atherosclerosis. No enlarged abdominal or
pelvic lymph nodes.

Reproductive: Prostate is unremarkable.

Other: No ascites, focal collection or pneumoperitoneum.

Musculoskeletal: No acute or suspicious bony abnormalities.
IMPRESSION: 1. 4 mm RIGHT UVJ calculus causing mild RIGHT hydronephrosis.
2. Nonobstructing bilateral renal calculi.
3. Hepatic steatosis.
4. Aortic Atherosclerosis ([id number]-1NH.H).

## 2022-01-30 IMAGING — US US RENAL
1 series · 15 of 25 positions shown · non-contrast
Comparison: Noncontrast CT 2 days ago 03/18/2021

CLINICAL DATA: Bilateral flank pain.

EXAM:
RENAL / URINARY TRACT ULTRASOUND COMPLETE

[Series 1: us renal mc & wl · 15 of 42 slices shown]
[im 1/42]
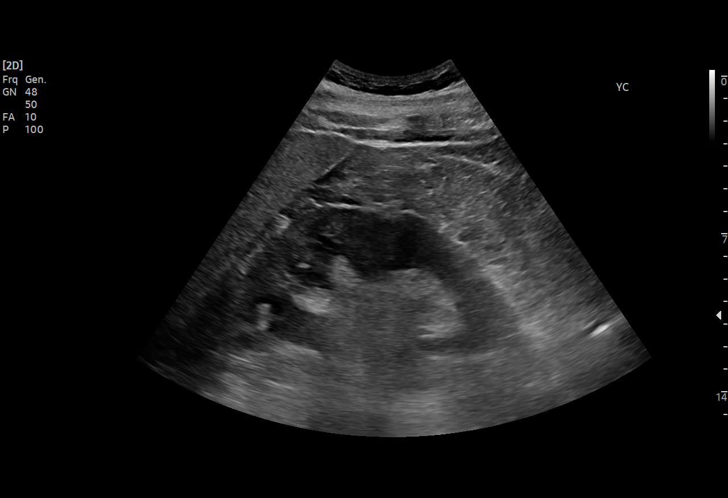
[im 4/42]
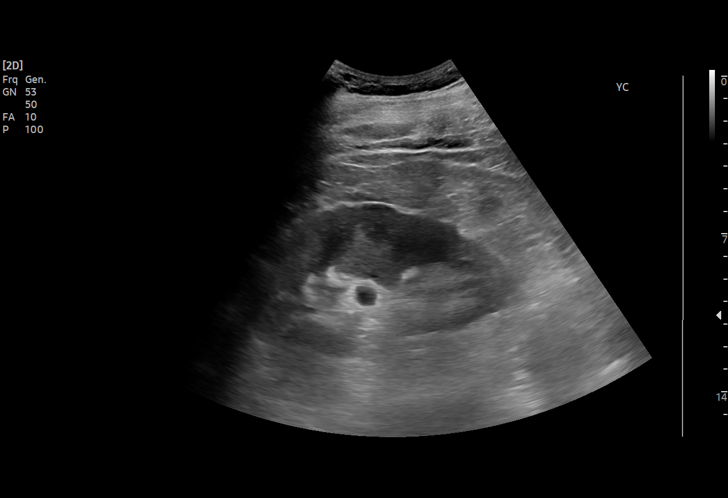
[im 7/42]
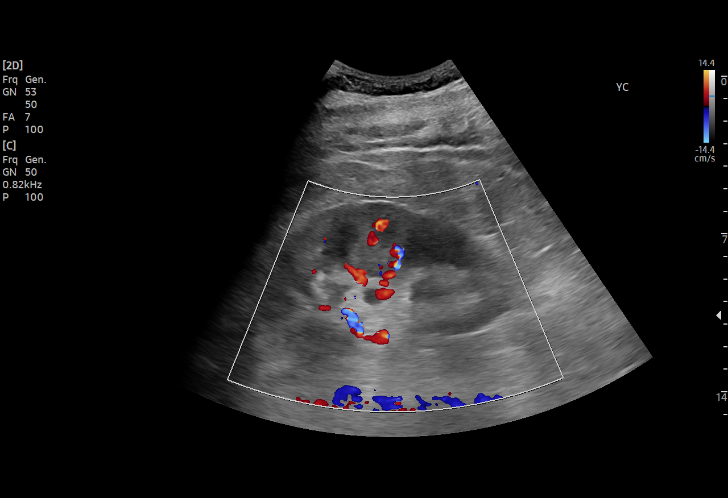
[im 9/42]
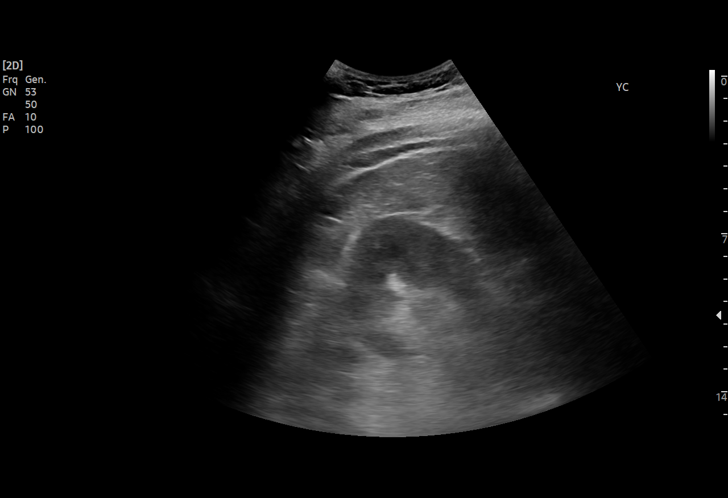
[im 12/42]
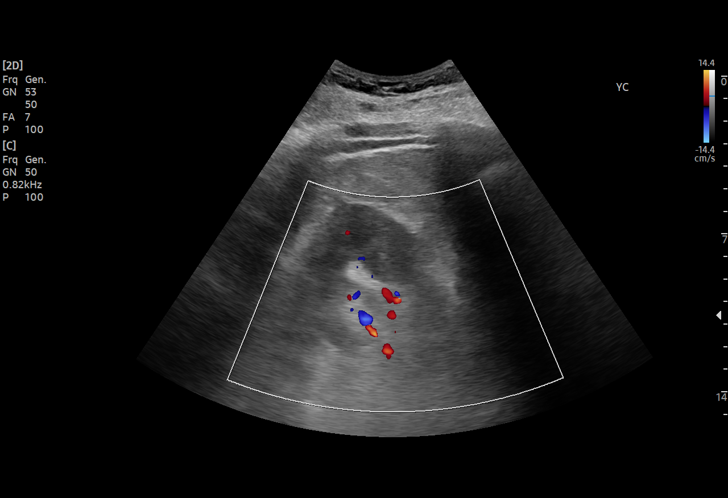
[im 16/42]
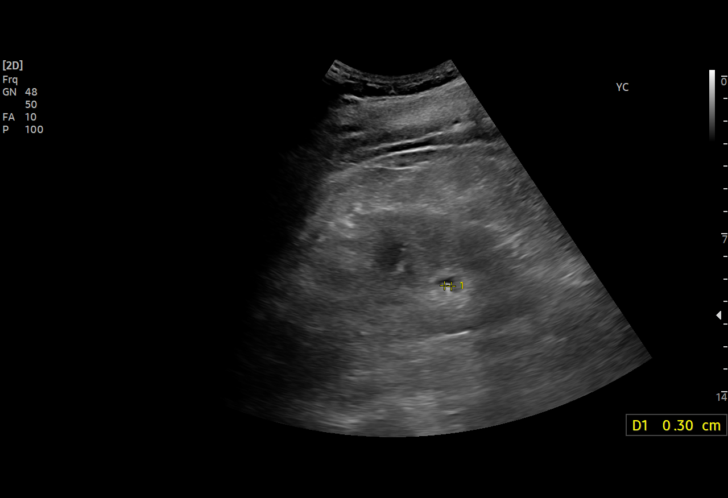
[im 18/42]
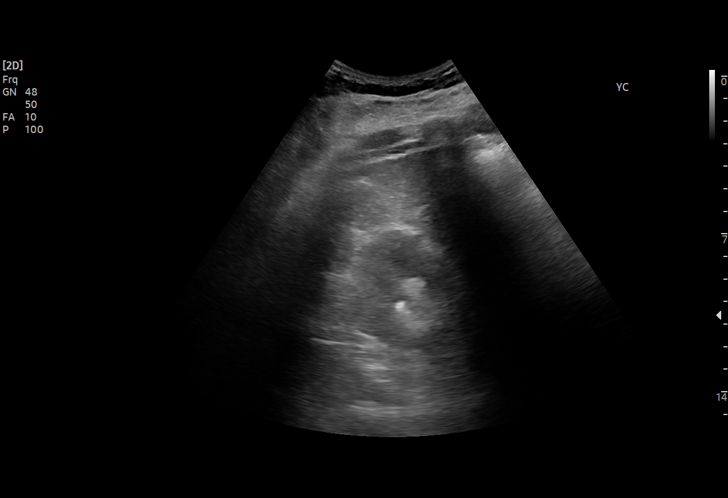
[im 21/42]
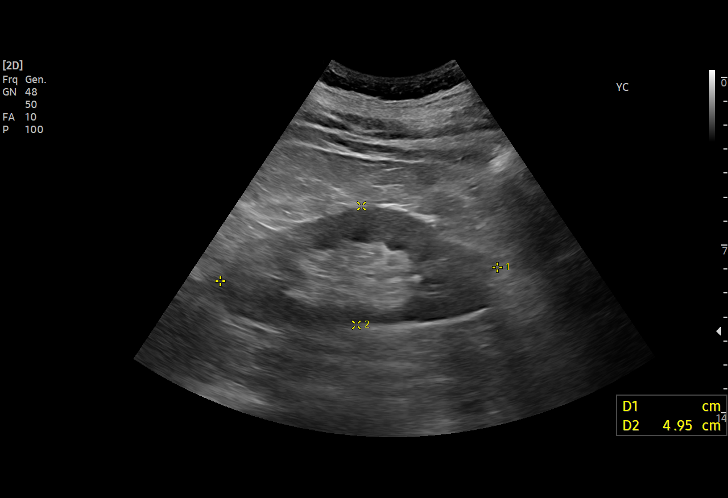
[im 24/42]
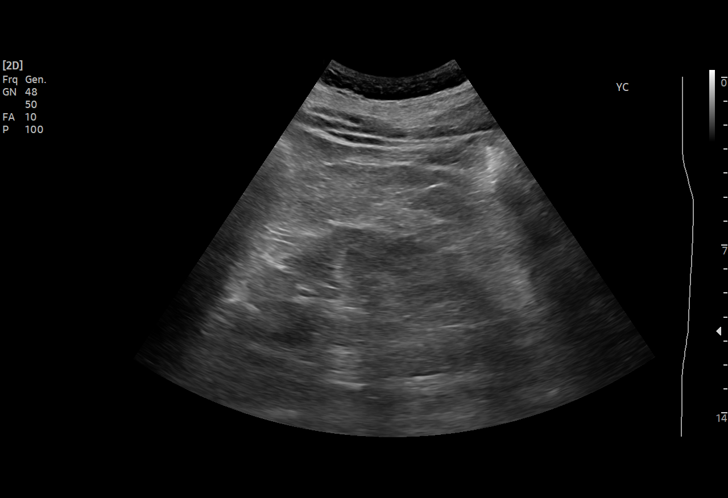
[im 26/42]
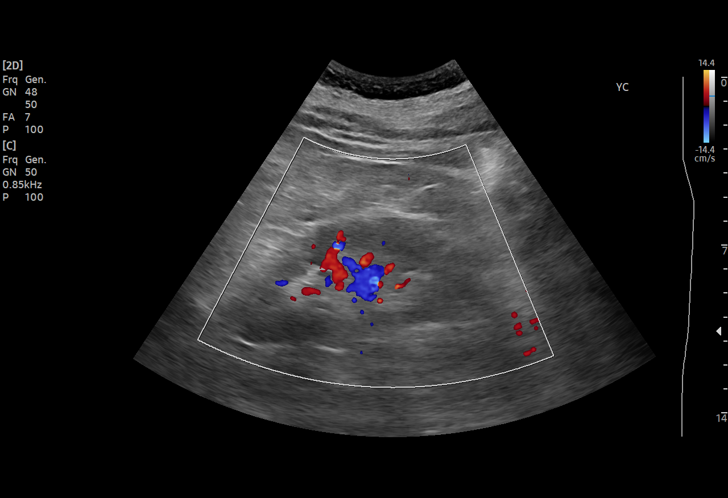
[im 30/42]
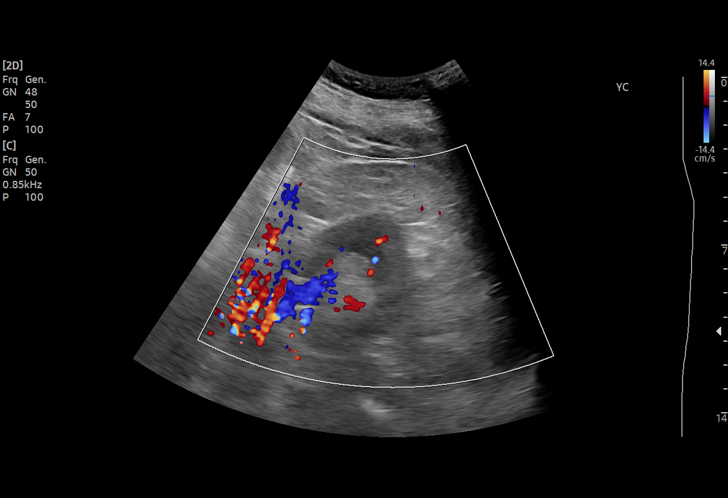
[im 33/42]
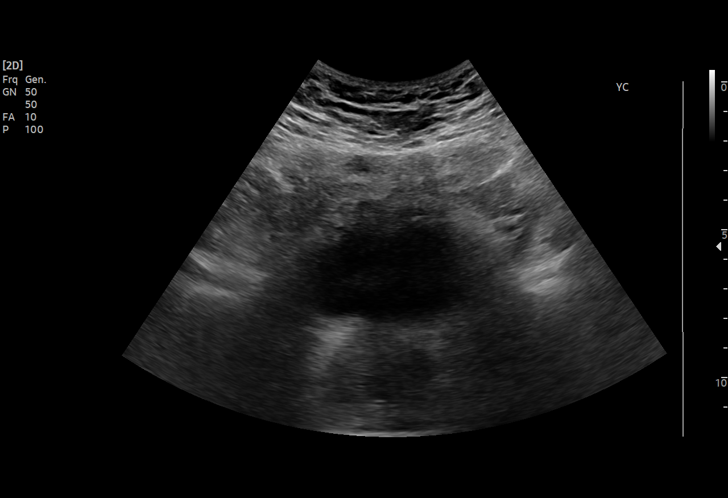
[im 35/42]
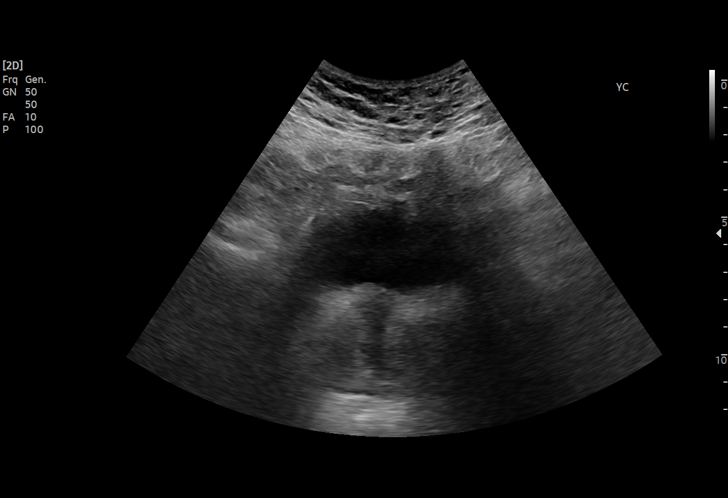
[im 38/42]
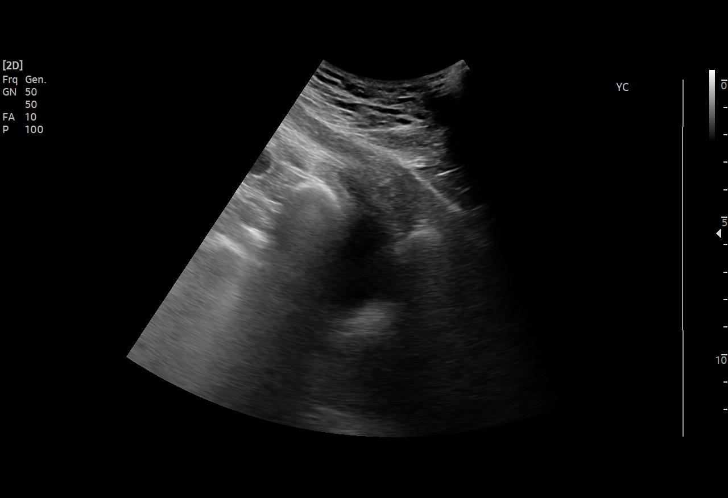
[im 42/42]
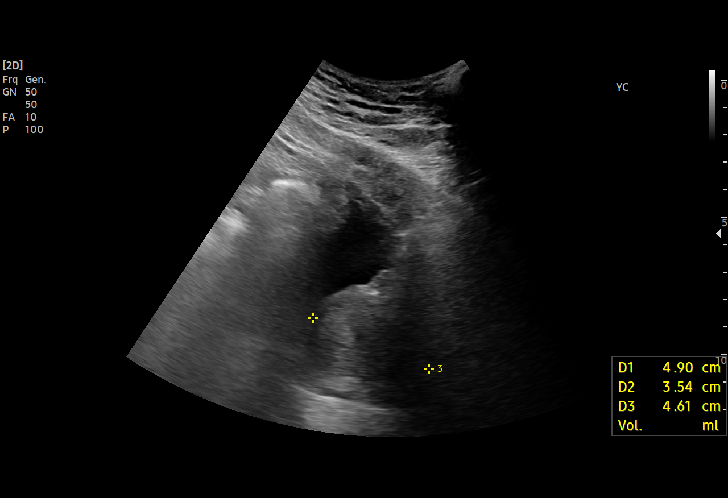

[15 of 25 positions shown; findings below may reference images not displayed]

FINDINGS: Right Kidney:

Renal measurements: 12.2 x 6.5 x 6.4 cm = volume: 267 mL. Minimal
prominence of the right renal collecting system without frank
hydronephrosis. There is a 4 mm nonobstructing stone in the lower
kidney. Renal cyst on prior CT is not well seen by ultrasound.
Normal parenchymal echogenicity.

Left Kidney:

Renal measurements: 11.5 x 5.0 x 4.5 cm = volume: 160 mL. No
hydronephrosis. Normal parenchymal echogenicity. No focal renal
lesion. Punctate stone on CT is not seen by ultrasound.

Bladder:

Near completely empty. Neither ureteral jet was seen. No obvious
stone in the bladder.

Other:

Prominent prostate gland. Distal right ureteral stone on CT is not
assessed.
IMPRESSION: 1. Mild prominence of the right renal collecting system without
frank hydronephrosis. Difficult to compare to recent CT due to
differences in modality, likely similar.
2. Intrarenal right renal stone. Punctate stone in the left kidney
on CT is not seen by ultrasound.

## 2022-02-18 ENCOUNTER — Other Ambulatory Visit: Payer: Medicare HMO

## 2022-02-18 DIAGNOSIS — Z79899 Other long term (current) drug therapy: Secondary | ICD-10-CM | POA: Diagnosis not present

## 2022-02-18 DIAGNOSIS — E7800 Pure hypercholesterolemia, unspecified: Secondary | ICD-10-CM | POA: Diagnosis not present

## 2022-02-18 DIAGNOSIS — M67873 Other specified disorders of tendon, right ankle and foot: Secondary | ICD-10-CM | POA: Diagnosis not present

## 2022-02-18 LAB — LIPID PANEL
Chol/HDL Ratio: 2.8 ratio (ref 0.0–5.0)
Cholesterol, Total: 111 mg/dL (ref 100–199)
HDL: 40 mg/dL (ref >39)
LDL Chol Calc (NIH): 51 mg/dL (ref 0–99)
Triglycerides: 106 mg/dL (ref 0–149)
VLDL Cholesterol Cal: 20 mg/dL (ref 5–40)

## 2022-02-28 ENCOUNTER — Encounter: Payer: Self-pay | Admitting: Cardiology

## 2022-03-01 ENCOUNTER — Other Ambulatory Visit: Payer: Self-pay | Admitting: Cardiology

## 2022-03-01 DIAGNOSIS — G4733 Obstructive sleep apnea (adult) (pediatric): Secondary | ICD-10-CM | POA: Diagnosis not present

## 2022-03-01 MED ORDER — PRAVASTATIN SODIUM 20 MG PO TABS: 20.0000 mg | ORAL_TABLET | Freq: Every evening | ORAL | 3 refills | Status: DC

## 2022-03-04 DIAGNOSIS — M79671 Pain in right foot: Secondary | ICD-10-CM | POA: Diagnosis not present

## 2022-03-11 DIAGNOSIS — G4733 Obstructive sleep apnea (adult) (pediatric): Secondary | ICD-10-CM | POA: Diagnosis not present

## 2022-03-19 DIAGNOSIS — H401111 Primary open-angle glaucoma, right eye, mild stage: Secondary | ICD-10-CM | POA: Diagnosis not present

## 2022-03-26 DIAGNOSIS — M722 Plantar fascial fibromatosis: Secondary | ICD-10-CM | POA: Diagnosis not present

## 2022-03-26 DIAGNOSIS — R29898 Other symptoms and signs involving the musculoskeletal system: Secondary | ICD-10-CM | POA: Diagnosis not present

## 2022-03-26 DIAGNOSIS — M79671 Pain in right foot: Secondary | ICD-10-CM | POA: Diagnosis not present

## 2022-04-02 DIAGNOSIS — M25571 Pain in right ankle and joints of right foot: Secondary | ICD-10-CM | POA: Diagnosis not present

## 2022-04-09 DIAGNOSIS — R29898 Other symptoms and signs involving the musculoskeletal system: Secondary | ICD-10-CM | POA: Diagnosis not present

## 2022-04-09 DIAGNOSIS — M722 Plantar fascial fibromatosis: Secondary | ICD-10-CM | POA: Diagnosis not present

## 2022-05-23 DIAGNOSIS — Z1331 Encounter for screening for depression: Secondary | ICD-10-CM | POA: Diagnosis not present

## 2022-05-23 DIAGNOSIS — Z23 Encounter for immunization: Secondary | ICD-10-CM | POA: Diagnosis not present

## 2022-05-23 DIAGNOSIS — G8929 Other chronic pain: Secondary | ICD-10-CM | POA: Diagnosis not present

## 2022-05-23 DIAGNOSIS — Z0000 Encounter for general adult medical examination without abnormal findings: Secondary | ICD-10-CM | POA: Diagnosis not present

## 2022-05-23 DIAGNOSIS — E7800 Pure hypercholesterolemia, unspecified: Secondary | ICD-10-CM | POA: Diagnosis not present

## 2022-05-23 DIAGNOSIS — I10 Essential (primary) hypertension: Secondary | ICD-10-CM | POA: Diagnosis not present

## 2022-05-23 DIAGNOSIS — Z1211 Encounter for screening for malignant neoplasm of colon: Secondary | ICD-10-CM | POA: Diagnosis not present

## 2022-05-23 DIAGNOSIS — N400 Enlarged prostate without lower urinary tract symptoms: Secondary | ICD-10-CM | POA: Diagnosis not present

## 2022-06-17 DIAGNOSIS — H401111 Primary open-angle glaucoma, right eye, mild stage: Secondary | ICD-10-CM | POA: Diagnosis not present

## 2022-07-23 DIAGNOSIS — G4733 Obstructive sleep apnea (adult) (pediatric): Secondary | ICD-10-CM | POA: Diagnosis not present

## 2022-08-20 ENCOUNTER — Other Ambulatory Visit: Payer: Self-pay

## 2022-08-20 MED ORDER — FUROSEMIDE 20 MG PO TABS: 20.0000 mg | ORAL_TABLET | Freq: Every day | ORAL | 1 refills | Status: DC

## 2022-09-23 DIAGNOSIS — H401111 Primary open-angle glaucoma, right eye, mild stage: Secondary | ICD-10-CM | POA: Diagnosis not present

## 2022-11-11 ENCOUNTER — Ambulatory Visit: Payer: Medicare HMO | Attending: Cardiology

## 2022-11-11 DIAGNOSIS — I7121 Aneurysm of the ascending aorta, without rupture: Secondary | ICD-10-CM

## 2022-11-11 DIAGNOSIS — I77810 Thoracic aortic ectasia: Secondary | ICD-10-CM

## 2022-11-11 LAB — BASIC METABOLIC PANEL
BUN/Creatinine Ratio: 11 (ref 10–24)
BUN: 12 mg/dL (ref 8–27)
CO2: 26 mmol/L (ref 20–29)
Calcium: 9.2 mg/dL (ref 8.6–10.2)
Chloride: 105 mmol/L (ref 96–106)
Creatinine, Ser: 1.13 mg/dL (ref 0.76–1.27)
Glucose: 121 mg/dL — ABNORMAL HIGH (ref 70–99)
Potassium: 4.1 mmol/L (ref 3.5–5.2)
Sodium: 142 mmol/L (ref 134–144)
eGFR: 71 mL/min/{1.73_m2} (ref >59)

## 2022-11-18 ENCOUNTER — Inpatient Hospital Stay: Admission: RE | Admit: 2022-11-18 | Payer: Medicare HMO | Source: Ambulatory Visit

## 2022-11-21 DIAGNOSIS — E7800 Pure hypercholesterolemia, unspecified: Secondary | ICD-10-CM | POA: Diagnosis not present

## 2022-11-21 DIAGNOSIS — I712 Thoracic aortic aneurysm, without rupture, unspecified: Secondary | ICD-10-CM | POA: Diagnosis not present

## 2022-11-21 DIAGNOSIS — R7303 Prediabetes: Secondary | ICD-10-CM | POA: Diagnosis not present

## 2022-11-21 DIAGNOSIS — N400 Enlarged prostate without lower urinary tract symptoms: Secondary | ICD-10-CM | POA: Diagnosis not present

## 2022-11-21 DIAGNOSIS — G8929 Other chronic pain: Secondary | ICD-10-CM | POA: Diagnosis not present

## 2022-11-21 DIAGNOSIS — G4733 Obstructive sleep apnea (adult) (pediatric): Secondary | ICD-10-CM | POA: Diagnosis not present

## 2022-11-21 DIAGNOSIS — I10 Essential (primary) hypertension: Secondary | ICD-10-CM | POA: Diagnosis not present

## 2023-01-02 DIAGNOSIS — H401111 Primary open-angle glaucoma, right eye, mild stage: Secondary | ICD-10-CM | POA: Diagnosis not present

## 2023-01-02 DIAGNOSIS — G4733 Obstructive sleep apnea (adult) (pediatric): Secondary | ICD-10-CM | POA: Diagnosis not present

## 2023-01-03 DIAGNOSIS — R69 Illness, unspecified: Secondary | ICD-10-CM | POA: Diagnosis not present

## 2023-01-08 DIAGNOSIS — Z0279 Encounter for issue of other medical certificate: Secondary | ICD-10-CM

## 2023-01-23 DIAGNOSIS — R69 Illness, unspecified: Secondary | ICD-10-CM | POA: Diagnosis not present

## 2023-01-24 DIAGNOSIS — R69 Illness, unspecified: Secondary | ICD-10-CM | POA: Diagnosis not present

## 2023-01-25 DIAGNOSIS — R69 Illness, unspecified: Secondary | ICD-10-CM | POA: Diagnosis not present

## 2023-01-28 NOTE — Progress Notes (Signed)
Cardiology Office Note:    Date:  01/31/2023   ID:  Curtis Warren, DOB Aug 17, 1956, MRN [id number]  PCP:  Curtis Warren   [facility] Providers Cardiologist:  Curtis Alexander, Warren {  Referring Warren: Curtis Warren    History of Present Illness:    Curtis Warren is a 67 y.o. male with a hx of HTN and borderline ascending aortic aneurysm who was previously followed by Dr. Delton Warren who now returns to clinic for follow-up.  Per review of the record, he had a prior low risk Myoview in 2017. TTE in 2017 showed an EF of 65 to 70% with grade 1 diastolic dysfunction. He was put on low dose Lasix then for an elevated LVEDP. His last CTA chest on 11/20/20 demonstrated a dilated ascending 4.0cm. Was recommended for annual follow-up.  Had CTA for monitoring dilated aorta on 10/2021 which showed ascending aorta measuring 3.9cm.  Was last seen in 12/2021 where he was doing well from a CV standpoint.  Today, the patient overall feels well. No chest pain, SOB, orthopnea, or PND. Remains active with no exertional symptoms. LE edema is controlled. Blood pressure is well controlled. Tolerating medications as prescribed.    Past Medical History:  Diagnosis Date   AAA (abdominal aortic aneurysm) (HCC)    Back pain    BPH (benign prostatic hyperplasia)    Diastolic dysfunction without heart failure    Dilatation of thoracic aorta (HCC)    Essential hypertension    Obesity    Pre-diabetes     Past Surgical History:  Procedure Laterality Date   COLONOSCOPY     HEMORRHOID SURGERY     POLYPECTOMY      Current Medications: Current Meds  Medication Sig   aspirin EC 81 MG tablet Take 81 mg by mouth daily.   cetirizine (ZYRTEC) 10 MG tablet Take 10 mg by mouth as needed for allergies or rhinitis.   finasteride (PROSCAR) 5 MG tablet Take 5 mg by mouth daily.   latanoprost (XALATAN) 0.005 % ophthalmic solution Place 1 drop into the right eye at bedtime.   lisinopril (ZESTRIL) 5 MG  tablet Take 1 tablet (5 mg total) by mouth daily.   pravastatin (PRAVACHOL) 20 MG tablet Take 1 tablet (20 mg total) by mouth every evening.   Tamsulosin HCl (FLOMAX) 0.4 MG CAPS Take 0.4 mg by mouth daily. Reported on 12/08/2015   [DISCONTINUED] furosemide (LASIX) 20 MG tablet Take 1 tablet (20 mg total) by mouth daily.     Allergies:   Carvedilol   Social History   Socioeconomic History   Marital status: Married    Spouse name: Not on file   Number of children: Not on file   Years of education: Not on file   Highest education level: Not on file  Occupational History   Not on file  Tobacco Use   Smoking status: Never   Smokeless tobacco: Never  Vaping Use   Vaping Use: Never used  Substance and Sexual Activity   Alcohol use: No   Drug use: No   Sexual activity: Not on file  Other Topics Concern   Not on file  Social History Narrative   Not on file   Social Determinants of Health   Financial Resource Strain: Not on file  Food Insecurity: Not on file  Transportation Needs: Not on file  Physical Activity: Not on file  Stress: Not on file  Social Connections: Not on file     Family  History: The patient'sfamily history includes Alcoholism in his mother; Bone cancer in his father; Heart attack (age of onset: 12) in his father. There is no history of Colon cancer.  ROS:   As per HPI   EKGs/Labs/Other Studies Reviewed:    The following studies were reviewed today:  Cardiac Studies & Procedures     STRESS TESTS  NM MYOCAR MULTI W/SPECT W 12/09/2015  Narrative CLINICAL DATA:  67 year old with chest pain.  EXAM: MYOCARDIAL IMAGING WITH SPECT (REST AND EXERCISE)  GATED LEFT VENTRICULAR WALL MOTION STUDY  LEFT VENTRICULAR EJECTION FRACTION  TECHNIQUE: Standard myocardial SPECT imaging was performed after resting intravenous injection of 10 mCi Tc-56m sestamibi. Subsequently, exercise tolerance test was performed by the patient under the supervision of the  Cardiology staff. At peak-stress, 30 mCi Tc-10m sestamibi was injected intravenously and standard myocardial SPECT imaging was performed. Quantitative gated imaging was also performed to evaluate left ventricular wall motion, and estimate left ventricular ejection fraction.  COMPARISON:  None.  FINDINGS: Perfusion: No decreased activity in the left ventricle on stress imaging to suggest reversible ischemia or infarction.  Wall Motion: Normal left ventricular wall motion. No left ventricular dilation.  Left Ventricular Ejection Fraction: 59 %  End diastolic volume 78 ml  End systolic volume 32 ml  IMPRESSION: 1. No reversible ischemia or infarction.  2. Normal left ventricular wall motion.  3. Left ventricular ejection fraction is 59%.  4. Low-risk stress test findings*.  *2012 Appropriate Use Criteria for Coronary Revascularization Focused Update: J Am Coll Cardiol. 2012;59(9):857-881. http://content.dementiazones.com.aspx?articleid=1201161   Electronically Signed By: Curtis Warren M.D. On: 12/09/2015 11:47   ECHOCARDIOGRAM  ECHOCARDIOGRAM COMPLETE 12/12/2017  Narrative * [facility]* [address] [place], [place] [address] [phone]  ------------------------------------------------------------------- Echocardiography  Patient:    Curtis, Warren MR #:       [id number] Study Date: 12/12/2017 Gender:     M Age:        31 Height:     180.3 cm Weight:     110.7 kg BSA:        2.39 m^2 Pt. Status: Room:  ATTENDING    Curtis Magic, Warren Curtis Warren     Curtis Warren, M.D. REFERRING    Curtis Warren, M.D. PERFORMING   [facility], Outpatient SONOGRAPHER  Curtis Warren, RDCS  cc:  ------------------------------------------------------------------- LV EF: 60% -   65%  ------------------------------------------------------------------- Indications:      Hypertension  (I10).  ------------------------------------------------------------------- History:   PMH:  Ascending Aortic Aneurysm, Obstructive Sleep Apnea  Risk factors:  Family history of coronary artery disease. Hypertension.  ------------------------------------------------------------------- Study Conclusions  - Left ventricle: The cavity size was normal. Systolic function was normal. The estimated ejection fraction was in the range of 60% to 65%. Wall motion was normal; there were no regional wall motion abnormalities. Features are consistent with a pseudonormal left ventricular filling pattern, with concomitant abnormal relaxation and increased filling pressure (grade 2 diastolic dysfunction). - Aorta: Aortic root dimension: 39 mm (ED). Ascending aortic diameter: 40 mm (S). - Aortic root: The aortic root was mildly dilated. - Ascending aorta: The ascending aorta was mildly dilated. - Mitral valve: There was trivial regurgitation. - Left atrium: The atrium was mildly dilated. - Right ventricle: The cavity size was mildly dilated. Wall thickness was normal. - Tricuspid valve: There was mild regurgitation.  ------------------------------------------------------------------- Study data:  Comparison was made to the study of 12/09/2015.  Study status:  Routine.  Procedure:  Transthoracic echocardiography. Image quality was adequate.  Echocardiography.  M-mode, complete 2D, spectral Doppler, and color Doppler.  Birthdate: Patient birthdate: 10-02-1955.  Age:  Patient is 67 yr old.  Sex: Gender: male.    BMI: 34 kg/m^2.  Blood pressure:     126/60 Patient status:  Outpatient.  Study date:  Study date: 12/12/2017. Study time: 01:20 PM.  Location:  [facility]  -------------------------------------------------------------------  ------------------------------------------------------------------- Left ventricle:  The cavity size was normal. Systolic function was normal. The  estimated ejection fraction was in the range of 60% to 65%. Wall motion was normal; there were no regional wall motion abnormalities. Features are consistent with a pseudonormal left ventricular filling pattern, with concomitant abnormal relaxation and increased filling pressure (grade 2 diastolic dysfunction).  ------------------------------------------------------------------- Aortic valve:   Trileaflet; normal thickness leaflets. Mobility was not restricted.  Doppler:  Transvalvular velocity was within the normal range. There was no stenosis. There was no regurgitation.  ------------------------------------------------------------------- Aorta:  Aortic root: The aortic root was mildly dilated. Ascending aorta: The ascending aorta was mildly dilated.  ------------------------------------------------------------------- Mitral valve:   Structurally normal valve.   Mobility was not restricted.  Doppler:  Transvalvular velocity was within the normal range. There was no evidence for stenosis. There was trivial regurgitation.  ------------------------------------------------------------------- Left atrium:  The atrium was mildly dilated.  ------------------------------------------------------------------- Right ventricle:  The cavity size was mildly dilated. Wall thickness was normal. Systolic function was normal.  ------------------------------------------------------------------- Pulmonic valve:    Doppler:  Transvalvular velocity was within the normal range. There was no evidence for stenosis.  ------------------------------------------------------------------- Tricuspid valve:   Structurally normal valve.    Doppler: Transvalvular velocity was within the normal range. There was mild regurgitation.  ------------------------------------------------------------------- Pulmonary artery:   The main pulmonary artery was  normal-sized.  ------------------------------------------------------------------- Right atrium:  The atrium was normal in size.  ------------------------------------------------------------------- Pericardium:  There was no pericardial effusion.  ------------------------------------------------------------------- Systemic veins: Inferior vena cava: The vessel was normal in size.  ------------------------------------------------------------------- Measurements  Left ventricle                         Value        Reference LV ID, ED, PLAX chordal                49.9  mm     43 - 52 LV ID, ES, PLAX chordal                26.6  mm     23 - 38 LV fx shortening, PLAX chordal         47    %      >=29 LV PW thickness, ED                    9.58  mm     ---------- IVS/LV PW ratio, ED                    0.96         <=1.3 Stroke volume, 2D                      104   ml     ---------- Stroke volume/bsa, 2D                  43    ml/m^2 ---------- LV e&', lateral  6.96  cm/s   ---------- LV E/e&', lateral                       9.76         ---------- LV e&', medial                          5.44  cm/s   ---------- LV E/e&', medial                        12.48        ---------- LV e&', average                         6.2   cm/s   ---------- LV E/e&', average                       10.95        ----------  Ventricular septum                     Value        Reference IVS thickness, ED                      9.21  mm     ----------  LVOT                                   Value        Reference LVOT ID, S                             24    mm     ---------- LVOT area                              4.52  cm^2   ---------- LVOT peak velocity, S                  85    cm/s   ---------- LVOT mean velocity, S                  58.7  cm/s   ---------- LVOT VTI, S                            22.9  cm     ----------  Aorta                                  Value         Reference Aortic root ID, ED                     39    mm     ---------- Ascending aorta ID, A-P, S             40    mm     ----------  Left atrium                            Value  Reference LA ID, A-P, ES                         44    mm     ---------- LA ID/bsa, A-P                         1.84  cm/m^2 <=2.2 LA volume, S                           55.2  ml     ---------- LA volume/bsa, S                       23.1  ml/m^2 ---------- LA volume, ES, 1-p A4C                 52.4  ml     ---------- LA volume/bsa, ES, 1-p A4C             21.9  ml/m^2 ---------- LA volume, ES, 1-p A2C                 55.3  ml     ---------- LA volume/bsa, ES, 1-p A2C             23.1  ml/m^2 ----------  Mitral valve                           Value        Reference Mitral E-wave peak velocity            67.9  cm/s   ---------- Mitral A-wave peak velocity            78.6  cm/s   ---------- Mitral deceleration time       (H)     261   ms     150 - 230 Mitral E/A ratio, peak                 0.9          ----------  Pulmonary arteries                     Value        Reference PA pressure, S, DP                     25    mm Hg  <=30  Tricuspid valve                        Value        Reference Tricuspid regurg peak velocity         235   cm/s   ---------- Tricuspid peak RV-RA gradient          22    mm Hg  ----------  Right atrium                           Value        Reference RA ID, S-I, ES, A4C            (H)     50.7  mm     34 - 49 RA area, ES, A4C  14.9  cm^2   8.3 - 19.5 RA volume, ES, A/L                     35    ml     ---------- RA volume/bsa, ES, A/L                 14.6  ml/m^2 ----------  Right ventricle                        Value        Reference TAPSE                                  27.3  mm     ---------- RV s&', lateral, S                      14.5  cm/s   ----------  Legend: (L)  and  (H)  mark values outside specified reference  range.  ------------------------------------------------------------------- Prepared and Electronically Authenticated by  Curtis Magic, Warren 2019-04-12T19:59:50              EKG:  SB, HR 56-personally reviewed  Recent Labs: 11/11/2022: BUN 12; Creatinine, Ser 1.13; Potassium 4.1; Sodium 142   Recent Lipid Panel    Component Value Date/Time   CHOL 111 02/18/2022 0752   TRIG 106 02/18/2022 0752   HDL 40 02/18/2022 0752   CHOLHDL 2.8 02/18/2022 0752   CHOLHDL 4.6 12/09/2015 0502   VLDL 14 12/09/2015 0502   LDLCALC 51 02/18/2022 0752     Physical Exam:    VS:  BP 122/86   Pulse (!) 56   Ht 5\' 11"  (1.803 m)   Wt 227 lb 12.8 oz (103.3 kg)   SpO2 98%   BMI 31.77 kg/m     Wt Readings from Last 3 Encounters:  01/31/23 227 lb 12.8 oz (103.3 kg)  01/15/22 210 lb (95.3 kg)  01/07/22 234 lb 6.4 oz (106.3 kg)     GEN: Well nourished, well developed in no acute distress HEENT: Normal NECK: No JVD; No carotid bruits CARDIAC: RRR, no murmurs RESPIRATORY:  CTAB, no wheezes ABDOMEN: Soft, non-tender, non-distended MUSCULOSKELETAL:  No edema; No deformity. LE warm SKIN: Warm and dry NEUROLOGIC:  Alert and oriented x 3 PSYCHIATRIC:  Normal affect   ASSESSMENT:    1. Ascending aorta dilation (HCC)   2. Essential hypertension   3. Pure hypercholesterolemia   4. Leg edema   5. Medication management   6. Ascending aortic aneurysm, unspecified whether ruptured (HCC)   7. Aneurysm of ascending aorta without rupture (HCC)     PLAN:    In order of problems listed above:  #Mildly dilated ascending aorta: Measured 3.9 on last CTA 10/2021 which was stable from prior. Discussed how this is likely normal for his BSA. Will change to echo this year and then space out the screening if remains stable. -Continue serial monitoring with echoes; if stable, will move out monitoring echoes to every 2-3 years -Continue ASA 81mg  daily -Continue crestor 10mg  daily -Continue lisinopril 5mg   daily  #HTN: Very well controlled at home. -Continue lisinopril 5mg  daily  #HLD: -Continue crestor 10mg  daily -Check lipids today  #Chronic LE edema: Well controlled on lasix 20mg  daily. TTE 2019 with normal BiV function, G2DD. -Continue lasix 20mg  daily  Follow-up:  1 year with Dr.  O'Neal  Medication Adjustments/Labs and Tests Ordered: Current medicines are reviewed at length with the patient today.  Concerns regarding medicines are outlined above.   Orders Placed This Encounter  Procedures   Lipid Profile   EKG 12-Lead   ECHOCARDIOGRAM COMPLETE   Meds ordered this encounter  Medications   furosemide (LASIX) 20 MG tablet    Sig: Take 1 tablet (20 mg total) by mouth daily.    Dispense:  90 tablet    Refill:  3   Patient Instructions  Medication Instructions:   Your physician recommends that you continue on your current medications as directed. Please refer to the Current Medication list given to you today.  *If you need a refill on your cardiac medications before your next appointment, please call your pharmacy*   Lab Work:  TODAY--LIPIDS  If you have labs (blood work) drawn today and your tests are completely normal, you will receive your results only by: MyChart Message (if you have MyChart) OR A paper copy in the mail If you have any lab test that is abnormal or we need to change your treatment, we will call you to review the results.   Testing/Procedures:  Your physician has requested that you have an echocardiogram. Echocardiography is a painless test that uses sound waves to create images of your heart. It provides your doctor with information about the size and shape of your heart and how well your heart's chambers and valves are working. This procedure takes approximately one hour. There are no restrictions for this procedure. Please do NOT wear cologne, perfume, aftershave, or lotions (deodorant is allowed). Please arrive 15 minutes prior to your  appointment time.    Follow-Up: At [facility], you and your health needs are our priority.  As part of our continuing mission to provide you with exceptional heart care, we have created designated Provider Care Teams.  These Care Teams include your primary Cardiologist (physician) and Advanced Practice Providers (APPs -  Physician Assistants and Nurse Practitioners) who all work together to provide you with the care you need, when you need it.  We recommend signing up for the patient portal called "MyChart".  Sign up information is provided on this After Visit Summary.  MyChart is used to connect with patients for Virtual Visits (Telemedicine).  Patients are able to view lab/test results, encounter notes, upcoming appointments, etc.  Non-urgent messages can be sent to your provider as well.   To learn more about what you can do with MyChart, go to [email or web address].    Your next appointment:   1 year(s)  Provider:   DR. Gerri Spore O'NEAL     Signed, Meriam Sprague, Warren  01/31/2023 9:41 AM    [facility]

## 2023-01-31 ENCOUNTER — Encounter: Payer: Self-pay | Admitting: Cardiology

## 2023-01-31 ENCOUNTER — Ambulatory Visit: Payer: Medicare HMO | Attending: Cardiology | Admitting: Cardiology

## 2023-01-31 VITALS — BP 122/86 | HR 56 | Ht 71.0 in | Wt 227.8 lb

## 2023-01-31 DIAGNOSIS — R600 Localized edema: Secondary | ICD-10-CM

## 2023-01-31 DIAGNOSIS — I10 Essential (primary) hypertension: Secondary | ICD-10-CM | POA: Diagnosis not present

## 2023-01-31 DIAGNOSIS — Z79899 Other long term (current) drug therapy: Secondary | ICD-10-CM | POA: Diagnosis not present

## 2023-01-31 DIAGNOSIS — I7121 Aneurysm of the ascending aorta, without rupture: Secondary | ICD-10-CM | POA: Diagnosis not present

## 2023-01-31 DIAGNOSIS — E7800 Pure hypercholesterolemia, unspecified: Secondary | ICD-10-CM | POA: Diagnosis not present

## 2023-01-31 DIAGNOSIS — I77810 Thoracic aortic ectasia: Secondary | ICD-10-CM

## 2023-01-31 MED ORDER — FUROSEMIDE 20 MG PO TABS: 20.0000 mg | ORAL_TABLET | Freq: Every day | ORAL | 3 refills | Status: AC

## 2023-01-31 NOTE — Patient Instructions (Signed)
Medication Instructions:   Your physician recommends that you continue on your current medications as directed. Please refer to the Current Medication list given to you today.  *If you need a refill on your cardiac medications before your next appointment, please call your pharmacy*   Lab Work:  TODAY--LIPIDS  If you have labs (blood work) drawn today and your tests are completely normal, you will receive your results only by: MyChart Message (if you have MyChart) OR A paper copy in the mail If you have any lab test that is abnormal or we need to change your treatment, we will call you to review the results.   Testing/Procedures:  Your physician has requested that you have an echocardiogram. Echocardiography is a painless test that uses sound waves to create images of your heart. It provides your doctor with information about the size and shape of your heart and how well your heart's chambers and valves are working. This procedure takes approximately one hour. There are no restrictions for this procedure. Please do NOT wear cologne, perfume, aftershave, or lotions (deodorant is allowed). Please arrive 15 minutes prior to your appointment time.    Follow-Up: At [facility], you and your health needs are our priority.  As part of our continuing mission to provide you with exceptional heart care, we have created designated Provider Care Teams.  These Care Teams include your primary Cardiologist (physician) and Advanced Practice Providers (APPs -  Physician Assistants and Nurse Practitioners) who all work together to provide you with the care you need, when you need it.  We recommend signing up for the patient portal called "MyChart".  Sign up information is provided on this After Visit Summary.  MyChart is used to connect with patients for Virtual Visits (Telemedicine).  Patients are able to view lab/test results, encounter notes, upcoming appointments, etc.  Non-urgent messages  can be sent to your provider as well.   To learn more about what you can do with MyChart, go to [email or web address].    Your next appointment:   1 year(s)  Provider:   DR. Gerri Spore O'NEAL

## 2023-02-01 LAB — LIPID PANEL
Chol/HDL Ratio: 3.5 ratio (ref 0.0–5.0)
Cholesterol, Total: 143 mg/dL (ref 100–199)
HDL: 41 mg/dL (ref >39)
LDL Chol Calc (NIH): 77 mg/dL (ref 0–99)
Triglycerides: 142 mg/dL (ref 0–149)
VLDL Cholesterol Cal: 25 mg/dL (ref 5–40)

## 2023-02-03 ENCOUNTER — Telehealth: Payer: Self-pay | Admitting: *Deleted

## 2023-02-03 DIAGNOSIS — Z79899 Other long term (current) drug therapy: Secondary | ICD-10-CM

## 2023-02-03 DIAGNOSIS — E7800 Pure hypercholesterolemia, unspecified: Secondary | ICD-10-CM

## 2023-02-03 MED ORDER — PRAVASTATIN SODIUM 40 MG PO TABS: 40.0000 mg | ORAL_TABLET | Freq: Every evening | ORAL | 1 refills | Status: DC

## 2023-02-03 NOTE — Telephone Encounter (Signed)
The patient has been notified of the result and verbalized understanding.  All questions (if any) were answered.  Pt aware to increase his pravastatin to 40 mg po daily and come in for repeat lipids in 8 weeks.  Confirmed the pharmacy of choice with the pt.   Scheduled the pt for repeat lipids in 8 weeks on 03/31/23.  He is aware to come fasting to this lab appt.   Pt verbalized understanding and agrees with this plan.

## 2023-02-03 NOTE — Telephone Encounter (Signed)
-----   Message from Meriam Sprague, MD sent at 02/02/2023  7:49 PM EDT ----- His cholesterol went up from prior. Can we increase his prava to 40mg  daily and repeat lipids in 8 weeks?

## 2023-02-21 DIAGNOSIS — R69 Illness, unspecified: Secondary | ICD-10-CM | POA: Diagnosis not present

## 2023-03-03 ENCOUNTER — Ambulatory Visit (HOSPITAL_COMMUNITY): Payer: Medicare HMO | Attending: Cardiology

## 2023-03-03 DIAGNOSIS — I77810 Thoracic aortic ectasia: Secondary | ICD-10-CM | POA: Diagnosis not present

## 2023-03-03 DIAGNOSIS — I5030 Unspecified diastolic (congestive) heart failure: Secondary | ICD-10-CM

## 2023-03-03 DIAGNOSIS — I517 Cardiomegaly: Secondary | ICD-10-CM | POA: Diagnosis not present

## 2023-03-03 DIAGNOSIS — I7121 Aneurysm of the ascending aorta, without rupture: Secondary | ICD-10-CM | POA: Diagnosis not present

## 2023-03-03 DIAGNOSIS — I342 Nonrheumatic mitral (valve) stenosis: Secondary | ICD-10-CM | POA: Diagnosis not present

## 2023-03-03 LAB — ECHOCARDIOGRAM COMPLETE
Area-P 1/2: 2.97 cm2
S' Lateral: 3.1 cm

## 2023-03-17 DIAGNOSIS — H401131 Primary open-angle glaucoma, bilateral, mild stage: Secondary | ICD-10-CM | POA: Diagnosis not present

## 2023-03-17 DIAGNOSIS — Z0100 Encounter for examination of eyes and vision without abnormal findings: Secondary | ICD-10-CM | POA: Diagnosis not present

## 2023-03-24 DIAGNOSIS — G4733 Obstructive sleep apnea (adult) (pediatric): Secondary | ICD-10-CM | POA: Diagnosis not present

## 2023-03-31 ENCOUNTER — Ambulatory Visit: Payer: Medicare HMO | Attending: Cardiology

## 2023-03-31 DIAGNOSIS — Z79899 Other long term (current) drug therapy: Secondary | ICD-10-CM | POA: Diagnosis not present

## 2023-03-31 DIAGNOSIS — E7800 Pure hypercholesterolemia, unspecified: Secondary | ICD-10-CM

## 2023-04-11 DIAGNOSIS — M545 Low back pain, unspecified: Secondary | ICD-10-CM | POA: Diagnosis not present

## 2023-04-21 DIAGNOSIS — M545 Low back pain, unspecified: Secondary | ICD-10-CM | POA: Diagnosis not present

## 2023-04-21 DIAGNOSIS — M25551 Pain in right hip: Secondary | ICD-10-CM | POA: Diagnosis not present

## 2023-04-24 DIAGNOSIS — M25551 Pain in right hip: Secondary | ICD-10-CM | POA: Diagnosis not present

## 2023-04-28 DIAGNOSIS — G4733 Obstructive sleep apnea (adult) (pediatric): Secondary | ICD-10-CM | POA: Diagnosis not present

## 2023-05-13 DIAGNOSIS — R69 Illness, unspecified: Secondary | ICD-10-CM | POA: Diagnosis not present

## 2023-06-23 DIAGNOSIS — H401111 Primary open-angle glaucoma, right eye, mild stage: Secondary | ICD-10-CM | POA: Diagnosis not present

## 2023-07-17 ENCOUNTER — Other Ambulatory Visit: Payer: Self-pay

## 2023-07-17 DIAGNOSIS — E7800 Pure hypercholesterolemia, unspecified: Secondary | ICD-10-CM

## 2023-07-17 DIAGNOSIS — Z79899 Other long term (current) drug therapy: Secondary | ICD-10-CM

## 2023-07-17 MED ORDER — PRAVASTATIN SODIUM 40 MG PO TABS: 40.0000 mg | ORAL_TABLET | Freq: Every evening | ORAL | 1 refills | Status: AC

## 2023-08-05 DIAGNOSIS — G4733 Obstructive sleep apnea (adult) (pediatric): Secondary | ICD-10-CM | POA: Diagnosis not present

## 2023-08-19 DIAGNOSIS — Z860101 Personal history of adenomatous and serrated colon polyps: Secondary | ICD-10-CM | POA: Diagnosis not present

## 2023-08-19 DIAGNOSIS — K635 Polyp of colon: Secondary | ICD-10-CM | POA: Diagnosis not present

## 2023-08-19 DIAGNOSIS — D124 Benign neoplasm of descending colon: Secondary | ICD-10-CM | POA: Diagnosis not present

## 2023-08-19 DIAGNOSIS — D122 Benign neoplasm of ascending colon: Secondary | ICD-10-CM | POA: Diagnosis not present

## 2023-08-19 DIAGNOSIS — Z09 Encounter for follow-up examination after completed treatment for conditions other than malignant neoplasm: Secondary | ICD-10-CM | POA: Diagnosis not present

## 2023-08-19 DIAGNOSIS — D123 Benign neoplasm of transverse colon: Secondary | ICD-10-CM | POA: Diagnosis not present

## 2023-08-21 DIAGNOSIS — D124 Benign neoplasm of descending colon: Secondary | ICD-10-CM | POA: Diagnosis not present

## 2023-08-21 DIAGNOSIS — K635 Polyp of colon: Secondary | ICD-10-CM | POA: Diagnosis not present

## 2023-08-21 DIAGNOSIS — D122 Benign neoplasm of ascending colon: Secondary | ICD-10-CM | POA: Diagnosis not present

## 2023-08-21 DIAGNOSIS — D123 Benign neoplasm of transverse colon: Secondary | ICD-10-CM | POA: Diagnosis not present

## 2023-08-29 DIAGNOSIS — H25812 Combined forms of age-related cataract, left eye: Secondary | ICD-10-CM | POA: Diagnosis not present

## 2023-08-29 DIAGNOSIS — H401132 Primary open-angle glaucoma, bilateral, moderate stage: Secondary | ICD-10-CM | POA: Diagnosis not present

## 2023-09-08 ENCOUNTER — Other Ambulatory Visit: Payer: Self-pay

## 2023-09-20 DIAGNOSIS — M79672 Pain in left foot: Secondary | ICD-10-CM | POA: Diagnosis not present

## 2023-12-22 DIAGNOSIS — H401111 Primary open-angle glaucoma, right eye, mild stage: Secondary | ICD-10-CM | POA: Diagnosis not present

## 2023-12-23 DIAGNOSIS — R7303 Prediabetes: Secondary | ICD-10-CM | POA: Diagnosis not present

## 2023-12-23 DIAGNOSIS — I712 Thoracic aortic aneurysm, without rupture, unspecified: Secondary | ICD-10-CM | POA: Diagnosis not present

## 2023-12-23 DIAGNOSIS — E7800 Pure hypercholesterolemia, unspecified: Secondary | ICD-10-CM | POA: Diagnosis not present

## 2023-12-23 DIAGNOSIS — I10 Essential (primary) hypertension: Secondary | ICD-10-CM | POA: Diagnosis not present

## 2023-12-23 DIAGNOSIS — N400 Enlarged prostate without lower urinary tract symptoms: Secondary | ICD-10-CM | POA: Diagnosis not present

## 2023-12-23 DIAGNOSIS — Z23 Encounter for immunization: Secondary | ICD-10-CM | POA: Diagnosis not present

## 2024-01-09 DIAGNOSIS — G4733 Obstructive sleep apnea (adult) (pediatric): Secondary | ICD-10-CM | POA: Diagnosis not present

## 2024-02-05 ENCOUNTER — Telehealth: Payer: Self-pay | Admitting: Cardiovascular Disease

## 2024-02-05 NOTE — Telephone Encounter (Signed)
 Wife Arlyce Berger) stated patient will need a letter from his cardiologist stating that it is OK for him to work.

## 2024-02-05 NOTE — Telephone Encounter (Signed)
 Pts wife called to report that the pt has his yearly assessment at work... he works for [employer]... he needs an updated letter stating he can work from a Cardiology perspective... 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 Pt does not do any strenuous work.   Will route to Dr Rolm Clos for his review.

## 2024-02-06 NOTE — Telephone Encounter (Addendum)
 Reason for walk-in: Walk-in Reasons: form/record drop-off (for provider mailbox or FMLA, etc.); Per previous notes, pt came into office asking for a doctor's note stating that he can work until his appt on 04/15/24 w/Dr. Rolm Clos (previous pt of Dr. Ardell Beauvais).  If patient is requesting to be seen today, or if patient is having symptoms:  What symptoms are being reported (if any)?  N/A  Route to triage pool and ensure Teams message has been sent to the Triage Walk-In chat.  3.   For medication samples, medication refills, HIM requests, appointment requests, lab-related requests, or form/record drop-off, please route to the appropriate pool.    *Per patient: Company's email (attention Tyra Galley), if possible: [email or web address]*

## 2024-02-06 NOTE — Telephone Encounter (Signed)
 Will send this to Dr Rolm Clos and his nurse however there is already a phone note from 02/05/24 that has been sent.   Will wait for Dr O'Neal's review.

## 2024-02-10 NOTE — Telephone Encounter (Signed)
 Patient identification verified by 2 forms. Sims Duck, RN     Called and spoke to patient  Relayed provider recommendations  Patient aware:  - May return to work without restrictions.  - Letter placed at coumadin front desk.    Patient verbalized understanding, no questions at this time

## 2024-04-14 NOTE — Progress Notes (Signed)
 Cardiology Office Note:  .   Date:  04/15/2024  ID:  Carlin JAYSON Herring, DOB 1956-06-19, MRN [id number] PCP: Claudene Pellet, MD  [facility] Providers Cardiologist:  Darryle ONEIDA Decent, MD { History of Present Illness: .    Chief Complaint  Patient presents with   Follow-up    ABSHIR PAOLINI is a 68 y.o. male with history of aortic dilation who presents for follow-up.   History of Present Illness   ROREY BISSON is a 68 year old male with aortic dilation who presents for follow-up.  He has a history of aortic dilation, specifically a mildly dilated ascending aorta, which has remained stable over the past decade. A CT scan in 2023 showed a dilation of 39 millimeters, and an echocardiogram in 2024 showed 40 millimeters. He is concerned about the perception of living with a 'time bomb' due to his aortic condition.  He denies any history of heart attack or stroke. He is currently on medication for high blood pressure, which is well-controlled. His current medications include Lasix 20 milligrams daily, lisinopril 5 milligrams daily, and pravastatin. He was taking a baby aspirin but has stopped it.  In terms of family history, his mother died of alcoholism, and his father died of bone cancer. He does not smoke or consume alcohol. He is married with two children and grandchildren. 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 He is physically active, playing golf regularly and walking a lot at work.  He feels he is in good health.          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 Pertinent positives noted in the HPI.     Studies Reviewed: SABRA   EKG Interpretation Date/Time:  Thursday April 15 2024 07:53:03 EDT Ventricular Rate:  68 PR Interval:  146 QRS Duration:  88 QT  Interval:  408 QTC Calculation: 433 R Axis:   49  Text Interpretation: Normal sinus rhythm Normal ECG Confirmed by Decent Darryle [phone]) on 04/15/2024 7:59:43 AM   CTA Chest 11/21/2021 IMPRESSION: Relatively unchanged size/configuration of the ascending aorta, estimated 3.9 cm on today's CT angiogram. Based on prior measurements, continuing annual imaging followup by CTA or MRA may be reasonable. This recommendation follows 2010 ACCF/AHA/AATS/ACR/ASA/SCA/SCAI/SIR/STS/SVM Guidelines for the Diagnosis and Management of Patients with Thoracic Aortic Disease. Circulation. 2010; 121: [id number]. Aortic aneurysm NOS (ICD10-I71.9) Physical Exam:   VS:  BP 122/74   Pulse 68   Ht 5' 11 (1.803 m)   Wt 236 lb 12.8 oz (107.4 kg)   SpO2 98%   BMI 33.03 kg/m    Wt Readings from Last 3 Encounters:  04/15/24 236 lb 12.8 oz (107.4 kg)  01/31/23 227 lb 12.8 oz (103.3 kg)  01/15/22 210 lb (95.3 kg)    GEN: Well nourished, well developed in no acute distress NECK: No JVD; No carotid bruits CARDIAC: RRR, no murmurs, rubs, gallops RESPIRATORY:  Clear to auscultation without rales, wheezing or rhonchi  ABDOMEN: Soft, non-tender, non-distended EXTREMITIES:  No edema; No deformity  ASSESSMENT AND PLAN: .   Assessment and Plan    Stable ascending aortic dilation Ascending aorta mildly dilated at 39-40 mm, stable for a decade, not a true aneurysm. No immediate intervention needed. - Order echocardiogram before next appointment to monitor aortic dilation. -  Provide letter stating no work-related restrictions.  Well-controlled hypertension Hypertension stable on lisinopril  and Lasix . - Continue lisinopril  5 mg daily. - Continue Lasix  20 mg daily.  Hyperlipidemia on statin therapy Hyperlipidemia controlled with pravastatin , no coronary calcium  on imaging. Aspirin discontinued. - Continue pravastatin . - Discontinue aspirin.              Follow-up: Return in about 1 year (around  04/15/2025).   Signed, Darryle DASEN. Barbaraann, MD, [facility]  [facility]  [address] [place], [place] [address] [phone]  8:11 AM

## 2024-04-15 ENCOUNTER — Encounter: Payer: Self-pay | Admitting: *Deleted

## 2024-04-15 ENCOUNTER — Ambulatory Visit: Payer: Self-pay | Attending: Cardiovascular Disease | Admitting: Cardiovascular Disease

## 2024-04-15 ENCOUNTER — Encounter: Payer: Self-pay | Admitting: Cardiovascular Disease

## 2024-04-15 VITALS — BP 122/74 | HR 68 | Ht 71.0 in | Wt 236.8 lb

## 2024-04-15 DIAGNOSIS — I77810 Thoracic aortic ectasia: Secondary | ICD-10-CM | POA: Diagnosis not present

## 2024-04-15 DIAGNOSIS — I10 Essential (primary) hypertension: Secondary | ICD-10-CM | POA: Diagnosis not present

## 2024-04-15 DIAGNOSIS — E7800 Pure hypercholesterolemia, unspecified: Secondary | ICD-10-CM | POA: Diagnosis not present

## 2024-04-15 NOTE — Patient Instructions (Addendum)
 Medication Instructions:  Please discontinue your Aspirin. Continue all other medications as listed.  *If you need a refill on your cardiac medications before your next appointment, please call your pharmacy*  Testing/Procedures: Your physician has requested that you have an echocardiogram in 1 year. Echocardiography is a painless test that uses sound waves to create images of your heart. It provides your doctor with information about the size and shape of your heart and how well your heart's chambers and valves are working. This procedure takes approximately one hour. There are no restrictions for this procedure. Please do NOT wear cologne, perfume, aftershave, or lotions (deodorant is allowed). Please arrive 15 minutes prior to your appointment time.  Please note: We ask at that you not bring children with you during ultrasound (echo/ vascular) testing. Due to room size and safety concerns, children are not allowed in the ultrasound rooms during exams. Our front office staff cannot provide observation of children in our lobby area while testing is being conducted. An adult accompanying a patient to their appointment will only be allowed in the ultrasound room at the discretion of the ultrasound technician under special circumstances. We apologize for any inconvenience.  Follow-Up: At [facility], you and your health needs are our priority.  As part of our continuing mission to provide you with exceptional heart care, our providers are all part of one team.  This team includes your primary Cardiologist (physician) and Advanced Practice Providers or APPs (Physician Assistants and Nurse Practitioners) who all work together to provide you with the care you need, when you need it.  Your next appointment:   1 year(s)  Provider:   One of our Advanced Practice Providers (APPs): Curtis Clause, PA-C  Curtis Satterfield, NP Curtis Shams, NP  Curtis Pavy, PA-C Curtis Beauvais, NP  Curtis Salen, PA-C Curtis Fabry, PA-C  Haugen, PA-C Curtis Dick, NP  Curtis Braver, NP Curtis Hails, PA-C  Curtis Donath, PA-C    Curtis Dunn, PA-C  Curtis Weaver, PA-C Curtis Louis, NP Curtis West, NP Curtis Goodrich, PA-C  Curtis Williams, PA-C Curtis Haley, PA-C  Curtis Zhao, NP Curtis Johnson, PA-C       We recommend signing up for the patient portal called MyChart.  Sign up information is provided on this After Visit Summary.  MyChart is used to connect with patients for Virtual Visits (Telemedicine).  Patients are able to view lab/test results, encounter notes, upcoming appointments, etc.  Non-urgent messages can be sent to your provider as well.   To learn more about what you can do with MyChart, go to [email or web address].

## 2024-06-08 ENCOUNTER — Other Ambulatory Visit: Payer: Self-pay

## 2024-08-04 ENCOUNTER — Other Ambulatory Visit: Payer: Self-pay
